# Patient Record
Sex: Male | Born: 1985
Health system: Southern US, Community
[De-identification: ages and names within clinical notes are randomized; demographics above are authoritative.]

## PROBLEM LIST (undated history)

## (undated) DIAGNOSIS — G5600 Carpal tunnel syndrome, unspecified upper limb: Secondary | ICD-10-CM

## (undated) DIAGNOSIS — E669 Obesity, unspecified: Secondary | ICD-10-CM

## (undated) HISTORY — DX: Carpal tunnel syndrome, unspecified upper limb: G56.00

## (undated) HISTORY — DX: Obesity, unspecified: E66.9

## (undated) HISTORY — PX: NO PAST SURGERIES: SHX2092

---

## 2014-11-05 ENCOUNTER — Encounter: Payer: Self-pay | Admitting: Family Medicine

## 2014-11-05 ENCOUNTER — Ambulatory Visit (INDEPENDENT_AMBULATORY_CARE_PROVIDER_SITE_OTHER): Admitting: Family Medicine

## 2014-11-05 ENCOUNTER — Ambulatory Visit
Admission: RE | Admit: 2014-11-05 | Discharge: 2014-11-05 | Disposition: A | Discharge status: Home / Self Care | Source: Ambulatory Visit | Attending: Family Medicine | Admitting: Family Medicine

## 2014-11-05 VITALS — BP 129/87 | HR 72 | Temp 98.1°F | Resp 16 | Ht 69.0 in | Wt 192.0 lb

## 2014-11-05 DIAGNOSIS — M722 Plantar fascial fibromatosis: Secondary | ICD-10-CM

## 2014-11-05 DIAGNOSIS — M79671 Pain in right foot: Secondary | ICD-10-CM | POA: Insufficient documentation

## 2014-11-05 DIAGNOSIS — M7661 Achilles tendinitis, right leg: Secondary | ICD-10-CM

## 2014-11-05 MED ORDER — NAPROXEN 500 MG PO TABS
500.0000 mg | ORAL_TABLET | Freq: Two times a day (BID) | ORAL | Status: DC
Start: 2014-11-05 — End: 2015-07-16

## 2014-11-05 NOTE — Patient Instructions (Signed)
Plantar Fasciitis  Plantar fasciitis is a common condition that causes foot pain. It is soreness (inflammation) of the band of tough fibrous tissue on the bottom of the foot that runs from the heel bone (calcaneus) to the ball of the foot. The cause of this soreness may be from excessive standing, poor fitting shoes, running on hard surfaces, being overweight, having an abnormal walk, or overuse (this is common in runners) of the painful foot or feet. It is also common in aerobic exercise dancers and ballet dancers.  SYMPTOMS   Most people with plantar fasciitis complain of:   Severe pain in the morning on the bottom of their foot especially when taking the first steps out of bed. This pain recedes after a few minutes of walking.   Severe pain is experienced also during walking following a long period of inactivity.   Pain is worse when walking barefoot or up stairs  DIAGNOSIS    Your caregiver will diagnose this condition by examining and feeling your foot.   Special tests such as X-rays of your foot, are usually not needed.  PREVENTION    Consult a sports medicine professional before beginning a new exercise program.   Walking programs offer a good workout. With walking there is a lower chance of overuse injuries common to runners. There is less impact and less jarring of the joints.   Begin all new exercise programs slowly. If problems or pain develop, decrease the amount of time or distance until you are at a comfortable level.   Wear good shoes and replace them regularly.   Stretch your foot and the heel cords at the back of the ankle (Achilles tendon) both before and after exercise.   Run or exercise on even surfaces that are not hard. For example, asphalt is better than pavement.   Do not run barefoot on hard surfaces.   If using a treadmill, vary the incline.   Do not continue to workout if you have foot or joint problems. Seek professional help if they do not improve.  HOME CARE INSTRUCTIONS     Avoid activities that cause you pain until you recover.   Use ice or cold packs on the problem or painful areas after working out.   Only take over-the-counter or prescription medicines for pain, discomfort, or fever as directed by your caregiver.   Soft shoe inserts or athletic shoes with air or gel sole cushions may be helpful.   If problems continue or become more severe, consult a sports medicine caregiver or your own health care provider. Cortisone is a potent anti-inflammatory medication that may be injected into the painful area. You can discuss this treatment with your caregiver.  MAKE SURE YOU:    Understand these instructions.   Will watch your condition.   Will get help right away if you are not doing well or get worse.  Document Released: 11/04/2000 Document Revised: 05/04/2011 Document Reviewed: 01/04/2008  [employer] Patient Information 2015 [employer]. This information is not intended to replace advice given to you by your health care provider. Make sure you discuss any questions you have with your health care provider.

## 2014-11-05 NOTE — Progress Notes (Signed)
Date:  11/05/2014   Name:  Jose Wheeler   DOB:  November 27, 1985   MRN:  [id number]  PCP:  Filbert Berthold, NP    Chief Complaint: Foot Pain   History of Present Illness:  This is a 29 y.o. male with 2.5 weeks of R heel pain worse in AM began after started running more but preventing him from running now. No known acute injury, no prior hx similar sxs.  Review of Systems:  Review of Systems  There are no active problems to display for this patient.   Prior to Admission medications   Medication Sig Start Date End Date Taking? Authorizing Provider  naproxen (NAPROSYN) 500 MG tablet Take 1 tablet (500 mg total) by mouth 2 (two) times daily with a meal. 11/05/14   Schuyler Amor, MD    Not on File  No past surgical history on file.  Social History  Substance Use Topics  . Smoking status: Not on file  . Smokeless tobacco: Not on file  . Alcohol Use: Not on file    No family history on file.  Medication list has been reviewed and updated.  Physical Examination: BP 129/87 mmHg  Pulse 72  Temp(Src) 98.1 F (36.7 C) (Oral)  Resp 16  Ht  (1.753 m)  Wt 192 lb (87.091 kg)  BMI 28.34 kg/m2  Physical Exam  Constitutional: He appears well-developed and well-nourished.  Musculoskeletal:  Tender anterior and posterior calcaneus, no obvious anatomic abnormality  Neurological: He is alert.  Skin: Skin is warm and dry.  Psychiatric: He has a normal mood and affect. His behavior is normal.    Assessment and Plan:  1. Heel pain, right R heel XR unremarkable per radiology - DG Os Calcis Right; Future  2. Plantar fasciitis of right foot Trial NSAID, exercises, OTC orthotics, consider podiatry referral if persists  3. Tendonitis, Achilles, right   Return if symptoms worsen or fail to improve.  Dionne Ano. Kingsley Spittle MD [facility]  11/05/2014

## 2015-02-08 ENCOUNTER — Encounter: Payer: Self-pay | Admitting: Family Medicine

## 2015-02-08 ENCOUNTER — Ambulatory Visit (INDEPENDENT_AMBULATORY_CARE_PROVIDER_SITE_OTHER): Admitting: Family Medicine

## 2015-02-08 VITALS — BP 140/88 | HR 88 | Temp 98.3°F | Resp 16 | Wt 190.0 lb

## 2015-02-08 DIAGNOSIS — J0111 Acute recurrent frontal sinusitis: Secondary | ICD-10-CM | POA: Diagnosis not present

## 2015-02-08 DIAGNOSIS — J020 Streptococcal pharyngitis: Secondary | ICD-10-CM

## 2015-02-08 DIAGNOSIS — J029 Acute pharyngitis, unspecified: Secondary | ICD-10-CM | POA: Diagnosis not present

## 2015-02-08 LAB — POCT RAPID STREP A (OFFICE): RAPID STREP A SCREEN: NEGATIVE

## 2015-02-08 MED ORDER — BENZONATATE 100 MG PO CAPS: 100.0000 mg | ORAL_CAPSULE | Freq: Two times a day (BID) | ORAL | Status: DC | PRN

## 2015-02-08 MED ORDER — AMOXICILLIN 500 MG PO CAPS: 500.0000 mg | ORAL_CAPSULE | Freq: Two times a day (BID) | ORAL | Status: DC

## 2015-02-08 NOTE — Progress Notes (Signed)
Subjective:    Patient ID: Jose Wheeler, male    DOB: 05/14/1985, 29 y.o.   MRN: [id number]  HPI: Jose Wheeler is a 29 y.o. male presenting on 02/08/2015 for Sore Throat   HPI  Pt presents for possible strep throat. All kids in the house are positive for strep as is his wife. . Pt has also had a cold. Sinus drainage, cough, pressure x 2 weeks. No chest tightness or trouble breathing. No fevers. Throat is sore. Ear pain.   No past medical history on file.  Current Outpatient Prescriptions on File Prior to Visit  Medication Sig  . naproxen (NAPROSYN) 500 MG tablet Take 1 tablet (500 mg total) by mouth 2 (two) times daily with a meal.   No current facility-administered medications on file prior to visit.    Review of Systems  Constitutional: Negative for fever and chills.  HENT: Positive for congestion, rhinorrhea, sinus pressure, sore throat and trouble swallowing. Negative for ear discharge, ear pain, postnasal drip and sneezing.   Respiratory: Positive for cough. Negative for chest tightness, shortness of breath and wheezing.   Cardiovascular: Negative for chest pain, palpitations and leg swelling.  Skin: Negative.   Allergic/Immunologic: Positive for environmental allergies.  Neurological: Negative for headaches.   Per HPI unless specifically indicated above     Objective:    BP 140/88 mmHg  Pulse 88  Temp(Src) 98.3 F (36.8 C)  Resp 16  Wt 190 lb (86.183 kg)  Wt Readings from Last 3 Encounters:  02/08/15 190 lb (86.183 kg)  11/05/14 192 lb (87.091 kg)    Physical Exam  Constitutional: He appears well-developed and well-nourished. No distress.  HENT:  Head: Normocephalic and atraumatic.  Right Ear: Tympanic membrane is not erythematous and not bulging.  Left Ear: Tympanic membrane is not erythematous and not bulging.  Nose: Mucosal edema, rhinorrhea and nasal septal hematoma present. No sinus tenderness. Right sinus exhibits maxillary sinus tenderness and frontal sinus  tenderness. Left sinus exhibits no maxillary sinus tenderness and no frontal sinus tenderness.  Mouth/Throat: Uvula is midline and mucous membranes are normal. No uvula swelling. Posterior oropharyngeal erythema (beefy red) present. No posterior oropharyngeal edema.  Neck: Normal range of motion. Neck supple. No Brudzinski's sign and no Kernig's sign noted.  Cardiovascular: Normal rate, regular rhythm and normal heart sounds.   Pulmonary/Chest: Effort normal and breath sounds normal. No accessory muscle usage. No tachypnea. No respiratory distress. He has no decreased breath sounds. He has no wheezes. He has no rhonchi. He has no rales.  Lymphadenopathy:    He has no cervical adenopathy.   Results for orders placed or performed in visit on 02/08/15  POCT rapid strep A  Result Value Ref Range   Rapid Strep A Screen Negative Negative      Assessment & Plan:   Problem List Items Addressed This Visit    None    Visit Diagnoses    Strep pharyngitis    -  Primary    Treat for strep considering family exposure. Send throat culture. Supportive care at home. RTC if symptoms not improved.     Relevant Medications    amoxicillin (AMOXIL) 500 MG capsule    Other Relevant Orders    POCT rapid strep A (Completed)    Culture, Group A Strep    Acute recurrent frontal sinusitis        Treat for sinus infection given duration of symptoms and clinical exam. Supportive care at home. RTC if not  improving.     Relevant Medications    amoxicillin (AMOXIL) 500 MG capsule    benzonatate (TESSALON) 100 MG capsule       Meds ordered this encounter  Medications  . amoxicillin (AMOXIL) 500 MG capsule    Sig: Take 1 capsule (500 mg total) by mouth 2 (two) times daily.    Dispense:  30 capsule    Refill:  0    Order Specific Question:  Supervising Provider    Answer:  Janeann Forehand [[id number]]  . benzonatate (TESSALON) 100 MG capsule    Sig: Take 1 capsule (100 mg total) by mouth 2 (two) times daily  as needed for cough.    Dispense:  30 capsule    Refill:  0    Order Specific Question:  Supervising Provider    Answer:  Janeann Forehand [[id number]]      Follow up plan: Return if symptoms worsen or fail to improve.

## 2015-02-08 NOTE — Patient Instructions (Signed)

## 2015-02-10 LAB — CULTURE, GROUP A STREP: Strep A Culture: NEGATIVE

## 2015-07-16 ENCOUNTER — Encounter: Payer: Self-pay | Admitting: Family Medicine

## 2015-07-16 ENCOUNTER — Ambulatory Visit (INDEPENDENT_AMBULATORY_CARE_PROVIDER_SITE_OTHER): Payer: 59 | Admitting: Family Medicine

## 2015-07-16 VITALS — BP 132/72 | HR 83 | Temp 98.5°F | Resp 16 | Ht 68.0 in | Wt 199.0 lb

## 2015-07-16 DIAGNOSIS — F419 Anxiety disorder, unspecified: Secondary | ICD-10-CM

## 2015-07-16 DIAGNOSIS — R0789 Other chest pain: Secondary | ICD-10-CM

## 2015-07-16 MED ORDER — ESCITALOPRAM OXALATE 10 MG PO TABS: 10.0000 mg | ORAL_TABLET | Freq: Every day | ORAL | Status: DC

## 2015-07-16 MED ORDER — RANITIDINE HCL 150 MG PO TABS: 150.0000 mg | ORAL_TABLET | Freq: Every day | ORAL | Status: DC

## 2015-07-16 NOTE — Patient Instructions (Signed)
I think your chest pain is anxiety related. However we will keep a close eye on it. If you continue to have chest pressure or tightness, palpitations, nausea, shortness of breath or other concerning symptoms, please go to the ER.  Anxiety: Lexapro- take 1/2 tablet at bedtime for 4 days, then increase to full tablet. Do not stop abruptly or double dose. Try 30 minutes of exercise daily. Youtube has great resources for mindfulness and meditation.

## 2015-07-16 NOTE — Assessment & Plan Note (Signed)
Likely source of chest symptoms. Start lexapro once daily. Reviewed non-pharm measures of mindfulness and meditation. Encouraged 30 minutes exercise daily. Check TSH, Vitamin B12, vitamin D.  Recheck 4 weeks.

## 2015-07-16 NOTE — Progress Notes (Signed)
Subjective:    Patient ID: Jose Wheeler, male    DOB: 04/01/1985, [age] y.o.   MRN: [id number]  HPI: Jose Wheeler is a 30 y.o. male presenting on 07/16/2015 for Chest Pain   HPI  Pt presents for chest pain x 1.5 months. Chest pain is most prominent R sided on sternal border-however chest tightness across the chest. Pain is described as "stitch" in the chest. Pain occurs at rest and with activity. Pain occurs when he feels anxious. Lasts about 30 minutes or more. No associated shortness of breath. No diaphoresis. Not radiating. No heavy lifting. Not tender to touch. When he feels stressed it occurs. Pain is relieved by taking a deep breath. Does not occur after meals. Has had some changes in life- left the [employer], insurance change, trying to buy house.  Family history of heart disease in grandfather after age 30.   Has not been ER for evaluation.   No past medical history on file.  No current outpatient prescriptions on file prior to visit.   No current facility-administered medications on file prior to visit.    Review of Systems  Constitutional: Negative for fever and chills.  HENT: Negative.   Respiratory: Positive for chest tightness. Negative for shortness of breath and wheezing.   Cardiovascular: Positive for chest pain. Negative for palpitations and leg swelling.  Gastrointestinal: Negative for nausea, vomiting and abdominal pain.  Endocrine: Negative.   Genitourinary: Negative for dysuria, urgency, discharge, penile pain and testicular pain.  Musculoskeletal: Negative for back pain, joint swelling and arthralgias.  Skin: Negative.   Neurological: Negative for dizziness, weakness, numbness and headaches.  Psychiatric/Behavioral: Negative for suicidal ideas, sleep disturbance and dysphoric mood. The patient is nervous/anxious.    Per HPI unless specifically indicated above GAD 7 : Generalized Anxiety Score 07/16/2015  Nervous, Anxious, on Edge 2  Control/stop worrying 2  Worry too  much - different things 3  Trouble relaxing 2  Restless 1  Easily annoyed or irritable 3  Afraid - awful might happen 0  Total GAD 7 Score 13  Anxiety Difficulty Somewhat difficult      Objective:    BP 132/72 mmHg  Pulse 83  Temp(Src) 98.5 F (36.9 C) (Oral)  Resp 16  Ht 5\' 8"  (1.727 m)  Wt 199 lb (90.266 kg)  BMI 30.26 kg/m2  Wt Readings from Last 3 Encounters:  07/16/15 199 lb (90.266 kg)  02/08/15 190 lb (86.183 kg)  11/05/14 192 lb (87.091 kg)    Physical Exam  Constitutional: He is oriented to person, place, and time. He appears well-developed and well-nourished. No distress.  HENT:  Head: Normocephalic and atraumatic.  Neck: Neck supple. No thyromegaly present.  Cardiovascular: Normal rate, regular rhythm and normal heart sounds.  Exam reveals no gallop and no friction rub.   No murmur heard. Pulmonary/Chest: Effort normal and breath sounds normal. He has no wheezes.  Abdominal: Soft. Bowel sounds are normal. He exhibits no distension. There is no tenderness. There is no rebound.  Musculoskeletal: Normal range of motion. He exhibits no edema or tenderness.  Neurological: He is alert and oriented to person, place, and time. He has normal reflexes.  Skin: Skin is warm and dry. No rash noted. No erythema.  Psychiatric: He has a normal mood and affect. His behavior is normal. Thought content normal.   Results for orders placed or performed in visit on 02/08/15  Culture, Group A Strep  Result Value Ref Range   Strep A Culture Negative  POCT rapid strep A  Result Value Ref Range   Rapid Strep A Screen Negative Negative      Assessment & Plan:   Problem List Items Addressed This Visit      Other   Anxiety    Likely source of chest symptoms. Start lexapro once daily. Reviewed non-pharm measures of mindfulness and meditation. Encouraged 30 minutes exercise daily. Check TSH, Vitamin B12, vitamin D.  Recheck 4 weeks.       Relevant Medications   escitalopram  (LEXAPRO) 10 MG tablet   Other Relevant Orders   TSH   VITAMIN D 25 Hydroxy (Vit-D Deficiency, Fractures)   B12 and Folate Panel    Other Visit Diagnoses    Right-sided chest wall pain    -  Primary    Likely non-cardiac. ECG WNL. MSK vs anxiety related. Treating anxiety. Check labs to stratify risk- CBC, lipid, CMET. Consider cardiology if symptoms persist. To ER with continued symptoms. Return 2 weeks.     Relevant Medications    ranitidine (ZANTAC) 150 MG tablet    Other Relevant Orders    EKG 12-Lead    Comprehensive metabolic panel    Lipid Profile    CBC With Differential       Meds ordered this encounter  Medications  . escitalopram (LEXAPRO) 10 MG tablet    Sig: Take 1 tablet (10 mg total) by mouth daily.    Dispense:  30 tablet    Refill:  11    Order Specific Question:  Supervising Provider    Answer:  Janeann Forehand [phone]  . DISCONTD: ranitidine (ZANTAC) 150 MG tablet    Sig: Take 1 tablet (150 mg total) by mouth at bedtime.    Dispense:  30 tablet    Order Specific Question:  Supervising Provider    Answer:  Janeann Forehand [phone]  . ranitidine (ZANTAC) 150 MG tablet    Sig: Take 1 tablet (150 mg total) by mouth at bedtime.    Dispense:  30 tablet    Refill:  1    Order Specific Question:  Supervising Provider    Answer:  Janeann Forehand [[id number]]      Follow up plan: Return in about 2 weeks (around 07/30/2015), or if symptoms worsen or fail to improve, for chest pain, 4 weeks for anxiety. Marland Kitchen

## 2015-10-21 ENCOUNTER — Ambulatory Visit (INDEPENDENT_AMBULATORY_CARE_PROVIDER_SITE_OTHER): Payer: 59 | Admitting: Family Medicine

## 2015-10-21 VITALS — BP 126/84 | HR 82 | Temp 99.0°F | Resp 16 | Ht 68.0 in | Wt 207.0 lb

## 2015-10-21 DIAGNOSIS — M722 Plantar fascial fibromatosis: Secondary | ICD-10-CM

## 2015-10-21 DIAGNOSIS — E669 Obesity, unspecified: Secondary | ICD-10-CM | POA: Diagnosis not present

## 2015-10-21 MED ORDER — NAPROXEN 500 MG PO TABS: 500.0000 mg | ORAL_TABLET | Freq: Two times a day (BID) | ORAL | 1 refills | Status: DC

## 2015-10-21 NOTE — Progress Notes (Signed)
Subjective:    Patient ID: Jose Wheeler, male    DOB: [birth year], 30 y.o.   MRN: [id number]  HPI: Jose Wheeler is a 30 y.o. male presenting on 10/21/2015 for Obesity (weight gain BMI high for insurance)   HPI  Pt presents for obesity. He needs an appeal for [employer] insurance. Has gained some weight since leaving the [employer]. Has late night eating- eats a bowl of cereal. Drinks soda- drinks about 1/2 liter of soda per day. No juice. 2% milk and water.  Also having R heel pain- burns on achielles and the heel. Injured in the winter. Has seen chiropractor and PT. No major help.   No past medical history on file.  Current Outpatient Prescriptions on File Prior to Visit  Medication Sig  . escitalopram (LEXAPRO) 10 MG tablet Take 1 tablet (10 mg total) by mouth daily.  . ranitidine (ZANTAC) 150 MG tablet Take 1 tablet (150 mg total) by mouth at bedtime.   No current facility-administered medications on file prior to visit.     Review of Systems  Constitutional: Negative for chills and fever.  HENT: Negative.   Respiratory: Negative for chest tightness, shortness of breath and wheezing.   Cardiovascular: Negative for chest pain, palpitations and leg swelling.  Gastrointestinal: Negative for abdominal pain, nausea and vomiting.  Endocrine: Negative.   Genitourinary: Negative for discharge, dysuria, penile pain, testicular pain and urgency.  Musculoskeletal: Negative for arthralgias, back pain and joint swelling.  Skin: Negative.   Neurological: Negative for dizziness, weakness, numbness and headaches.  Psychiatric/Behavioral: Negative for dysphoric mood and sleep disturbance.   Per HPI unless specifically indicated above     Objective:    BP 126/84 (BP Location: Left Arm, Patient Position: Sitting, Cuff Size: Normal)   Pulse 82   Temp 99 F (37.2 C) (Oral)   Resp 16   Ht 5\' 8"  (1.727 m)   Wt 207 lb (93.9 kg)   BMI 31.47 kg/m   Wt Readings from Last 3 Encounters:  10/21/15 207  lb (93.9 kg)  07/16/15 199 lb (90.3 kg)  02/08/15 190 lb (86.2 kg)    Physical Exam  Constitutional: He is oriented to person, place, and time. He appears well-developed and well-nourished. No distress.  HENT:  Head: Normocephalic and atraumatic.  Neck: Neck supple. No thyromegaly present.  Cardiovascular: Normal rate, regular rhythm and normal heart sounds.  Exam reveals no gallop and no friction rub.   No murmur heard. Pulmonary/Chest: Effort normal and breath sounds normal. He has no wheezes.  Abdominal: Soft. Bowel sounds are normal. He exhibits no distension. There is no tenderness. There is no rebound.  Musculoskeletal: Normal range of motion. He exhibits no edema.       Right foot: There is tenderness.       Feet:  Neurological: He is alert and oriented to person, place, and time. He has normal reflexes.  Skin: Skin is warm and dry. No rash noted. No erythema.  Psychiatric: He has a normal mood and affect. His behavior is normal. Thought content normal.   Results for orders placed or performed in visit on 02/08/15  Culture, Group A Strep  Result Value Ref Range   Strep A Culture Negative   POCT rapid strep A  Result Value Ref Range   Rapid Strep A Screen Negative Negative      Assessment & Plan:   Problem List Items Addressed This Visit      Other   Obesity  Discussed strategies for healthy eating. Avoid soda. Refer to nutrition. Exercise 150 minutes per week. Recheck weight 6 weeks.       Relevant Orders   Amb ref to Medical Nutrition Therapy-MNT    Other Visit Diagnoses    Plantar fasciitis, right    -  Primary   Naproxen BID. Ice the heal. Consider podiatry referral if not improving.    Relevant Medications   naproxen (NAPROSYN) 500 MG tablet      Meds ordered this encounter  Medications  . naproxen (NAPROSYN) 500 MG tablet    Sig: Take 1 tablet (500 mg total) by mouth 2 (two) times daily with a meal.    Dispense:  30 tablet    Refill:  1    Order  Specific Question:   Supervising Provider    Answer:   Janeann ForehandHAWKINS JR, JAMES H [[id number]]      Follow up plan: Return in about 6 weeks (around 12/02/2015) for Weight check.

## 2015-10-21 NOTE — Assessment & Plan Note (Signed)
Discussed strategies for healthy eating. Avoid soda. Refer to nutrition. Exercise 150 minutes per week. Recheck weight 6 weeks.

## 2015-10-21 NOTE — Patient Instructions (Addendum)
Your BMI is considered obese.  Losing weight will help you maintain health throughout your lifespan and prevent the development of chronic health conditions.  Please try to meet the goal of 150 minutes of exercise per week.  This is generally 30-40 minutes of moderate activity 3-4 times per week. Consider a calorie goal of 1800 per day. Eat mainly fruits, vegetables, and lean proteins (chicken or fish).  A great resource for healthy living and weight loss is https://hernandez-anderson.info/ Reduce with a goal to eliminate soda.  Call the [facility]- [phone]    Plantar Fasciitis With Rehab The plantar fascia is a fibrous, ligament-like, soft-tissue structure that spans the bottom of the foot. Plantar fasciitis, also called heel spur syndrome, is a condition that causes pain in the foot due to inflammation of the tissue. SYMPTOMS   Pain and tenderness on the underneath side of the foot.  Pain that worsens with standing or walking. CAUSES  Plantar fasciitis is caused by irritation and injury to the plantar fascia on the underneath side of the foot. Common mechanisms of injury include:  Direct trauma to bottom of the foot.  Damage to a small nerve that runs under the foot where the main fascia attaches to the heel bone.  Stress placed on the plantar fascia due to bone spurs. RISK INCREASES WITH:   Activities that place stress on the plantar fascia (running, jumping, pivoting, or cutting).  Poor strength and flexibility.  Improperly fitted shoes.  Tight calf muscles.  Flat feet.  Failure to warm-up properly before activity.  Obesity. PREVENTION  Warm up and stretch properly before activity.  Allow for adequate recovery between workouts.  Maintain physical fitness:  Strength, flexibility, and endurance.  Cardiovascular fitness.  Maintain a health body weight.  Avoid stress on the plantar fascia.  Wear properly fitted shoes, including arch supports for individuals who  have flat feet. PROGNOSIS  If treated properly, then the symptoms of plantar fasciitis usually resolve without surgery. However, occasionally surgery is necessary. RELATED COMPLICATIONS   Recurrent symptoms that may result in a chronic condition.  Problems of the lower back that are caused by compensating for the injury, such as limping.  Pain or weakness of the foot during push-off following surgery.  Chronic inflammation, scarring, and partial or complete fascia tear, occurring more often from repeated injections. TREATMENT  Treatment initially involves the use of ice and medication to help reduce pain and inflammation. The use of strengthening and stretching exercises may help reduce pain with activity, especially stretches of the Achilles tendon. These exercises may be performed at home or with a therapist. Your caregiver may recommend that you use heel cups of arch supports to help reduce stress on the plantar fascia. Occasionally, corticosteroid injections are given to reduce inflammation. If symptoms persist for greater than 6 months despite non-surgical (conservative), then surgery may be recommended.  MEDICATION   If pain medication is necessary, then nonsteroidal anti-inflammatory medications, such as aspirin and ibuprofen, or other minor pain relievers, such as acetaminophen, are often recommended.  Do not take pain medication within 7 days before surgery.  Prescription pain relievers may be given if deemed necessary by your caregiver. Use only as directed and only as much as you need.  Corticosteroid injections may be given by your caregiver. These injections should be reserved for the most serious cases, because they may only be given a certain number of times. HEAT AND COLD  Cold treatment (icing) relieves pain and reduces inflammation. Cold  treatment should be applied for 10 to 15 minutes every 2 to 3 hours for inflammation and pain and immediately after any activity that  aggravates your symptoms. Use ice packs or massage the area with a piece of ice (ice massage).  Heat treatment may be used prior to performing the stretching and strengthening activities prescribed by your caregiver, physical therapist, or athletic trainer. Use a heat pack or soak the injury in warm water. SEEK IMMEDIATE MEDICAL CARE IF:  Treatment seems to offer no benefit, or the condition worsens.  Any medications produce adverse side effects. EXERCISES RANGE OF MOTION (ROM) AND STRETCHING EXERCISES - Plantar Fasciitis (Heel Spur Syndrome) These exercises may help you when beginning to rehabilitate your injury. Your symptoms may resolve with or without further involvement from your physician, physical therapist or athletic trainer. While completing these exercises, remember:   Restoring tissue flexibility helps normal motion to return to the joints. This allows healthier, less painful movement and activity.  An effective stretch should be held for at least 30 seconds.  A stretch should never be painful. You should only feel a gentle lengthening or release in the stretched tissue. RANGE OF MOTION - Toe Extension, Flexion  Sit with your right / left leg crossed over your opposite knee.  Grasp your toes and gently pull them back toward the top of your foot. You should feel a stretch on the bottom of your toes and/or foot.  Hold this stretch for __________ seconds.  Now, gently pull your toes toward the bottom of your foot. You should feel a stretch on the top of your toes and or foot.  Hold this stretch for __________ seconds. Repeat __________ times. Complete this stretch __________ times per day.  RANGE OF MOTION - Ankle Dorsiflexion, Active Assisted  Remove shoes and sit on a chair that is preferably not on a carpeted surface.  Place right / left foot under knee. Extend your opposite leg for support.  Keeping your heel down, slide your right / left foot back toward the chair  until you feel a stretch at your ankle or calf. If you do not feel a stretch, slide your bottom forward to the edge of the chair, while still keeping your heel down.  Hold this stretch for __________ seconds. Repeat __________ times. Complete this stretch __________ times per day.  STRETCH - Gastroc, Standing  Place hands on wall.  Extend right / left leg, keeping the front knee somewhat bent.  Slightly point your toes inward on your back foot.  Keeping your right / left heel on the floor and your knee straight, shift your weight toward the wall, not allowing your back to arch.  You should feel a gentle stretch in the right / left calf. Hold this position for __________ seconds. Repeat __________ times. Complete this stretch __________ times per day. STRETCH - Soleus, Standing  Place hands on wall.  Extend right / left leg, keeping the other knee somewhat bent.  Slightly point your toes inward on your back foot.  Keep your right / left heel on the floor, bend your back knee, and slightly shift your weight over the back leg so that you feel a gentle stretch deep in your back calf.  Hold this position for __________ seconds. Repeat __________ times. Complete this stretch __________ times per day. STRETCH - Gastrocsoleus, Standing  Note: This exercise can place a lot of stress on your foot and ankle. Please complete this exercise only if specifically instructed by  your caregiver.   Place the ball of your right / left foot on a step, keeping your other foot firmly on the same step.  Hold on to the wall or a rail for balance.  Slowly lift your other foot, allowing your body weight to press your heel down over the edge of the step.  You should feel a stretch in your right / left calf.  Hold this position for __________ seconds.  Repeat this exercise with a slight bend in your right / left knee. Repeat __________ times. Complete this stretch __________ times per day.    STRENGTHENING EXERCISES - Plantar Fasciitis (Heel Spur Syndrome)  These exercises may help you when beginning to rehabilitate your injury. They may resolve your symptoms with or without further involvement from your physician, physical therapist or athletic trainer. While completing these exercises, remember:   Muscles can gain both the endurance and the strength needed for everyday activities through controlled exercises.  Complete these exercises as instructed by your physician, physical therapist or athletic trainer. Progress the resistance and repetitions only as guided. STRENGTH - Towel Curls  Sit in a chair positioned on a non-carpeted surface.  Place your foot on a towel, keeping your heel on the floor.  Pull the towel toward your heel by only curling your toes. Keep your heel on the floor.  If instructed by your physician, physical therapist or athletic trainer, add ____________________ at the end of the towel. Repeat __________ times. Complete this exercise __________ times per day. STRENGTH - Ankle Inversion  Secure one end of a rubber exercise band/tubing to a fixed object (table, pole). Loop the other end around your foot just before your toes.  Place your fists between your knees. This will focus your strengthening at your ankle.  Slowly, pull your big toe up and in, making sure the band/tubing is positioned to resist the entire motion.  Hold this position for __________ seconds.  Have your muscles resist the band/tubing as it slowly pulls your foot back to the starting position. Repeat __________ times. Complete this exercises __________ times per day.    This information is not intended to replace advice given to you by your health care provider. Make sure you discuss any questions you have with your health care provider.   Document Released: 02/09/2005 Document Revised: 06/26/2014 Document Reviewed: 05/24/2008 Elsevier Interactive Patient Education [employer]2016 Elsevier  Inc.

## 2015-12-02 ENCOUNTER — Ambulatory Visit: Payer: 59 | Admitting: Family Medicine

## 2016-04-17 ENCOUNTER — Ambulatory Visit (INDEPENDENT_AMBULATORY_CARE_PROVIDER_SITE_OTHER): Payer: 59 | Admitting: Physician Assistant

## 2016-04-17 ENCOUNTER — Encounter: Payer: Self-pay | Admitting: Physician Assistant

## 2016-04-17 VITALS — BP 137/76 | HR 85 | Temp 98.2°F | Resp 16 | Ht 68.0 in | Wt 209.0 lb

## 2016-04-17 DIAGNOSIS — J0110 Acute frontal sinusitis, unspecified: Secondary | ICD-10-CM | POA: Diagnosis not present

## 2016-04-17 MED ORDER — AMOXICILLIN-POT CLAVULANATE 875-125 MG PO TABS: 1.0000 | ORAL_TABLET | Freq: Two times a day (BID) | ORAL | 0 refills | Status: AC

## 2016-04-17 NOTE — Progress Notes (Signed)
   Subjective:    Patient ID: Jose ArJerry Pluta, male    DOB: 05/04/1985, 31 y.o.   MRN: [id number]  Jose Wheeler is a 31 y.o. male presenting on 04/17/2016 for Cough   HPI  Patient is a 31 y/o man with no contributory PMH presenting today with cough ongoing for two weeks. He has two young children who were sick two weeks ago and he caught an URI illness from them. He was ill for about a week, was starting to get better, and then started to feel worse. His cough has been persistent but nonproductive. Does endorse some sinus congestion and ear fullness. No fevers, chills, nausea, vomiting.   Social History  Substance Use Topics  . Smoking status: Never Smoker  . Smokeless tobacco: Never Used  . Alcohol use No    Review of Systems Per HPI unless specifically indicated above     Objective:    BP 137/76 (BP Location: Right Arm, Patient Position: Sitting, Cuff Size: Normal)   Pulse 85   Temp 98.2 F (36.8 C) (Oral)   Resp 16   Ht 5\' 8"  (1.727 m)   Wt 209 lb (94.8 kg)   BMI 31.78 kg/m   Wt Readings from Last 3 Encounters:  04/17/16 209 lb (94.8 kg)  10/21/15 207 lb (93.9 kg)  07/16/15 199 lb (90.3 kg)    Physical Exam Results for orders placed or performed in visit on 02/08/15  Culture, Group A Strep  Result Value Ref Range   Strep A Culture Negative   POCT rapid strep A  Result Value Ref Range   Rapid Strep A Screen Negative Negative      Assessment & Plan:   1. Acute non-recurrent frontal sinusitis  - amoxicillin-clavulanate (AUGMENTIN) 875-125 MG tablet; Take 1 tablet by mouth 2 (two) times daily.  Dispense: 20 tablet; Refill: 0  Return if symptoms worsen or fail to improve.  Osvaldo AngstAdriana Pollak, PA-C [facility] 04/17/2016, 10:04 AM

## 2016-04-17 NOTE — Patient Instructions (Signed)

## 2016-05-12 DIAGNOSIS — J019 Acute sinusitis, unspecified: Secondary | ICD-10-CM | POA: Diagnosis not present

## 2016-05-12 DIAGNOSIS — J029 Acute pharyngitis, unspecified: Secondary | ICD-10-CM | POA: Diagnosis not present

## 2016-06-11 ENCOUNTER — Ambulatory Visit
Admission: RE | Admit: 2016-06-11 | Discharge: 2016-06-11 | Disposition: A | Discharge status: Home / Self Care | Payer: 59 | Source: Ambulatory Visit | Attending: Nurse Practitioner | Admitting: Nurse Practitioner

## 2016-06-11 ENCOUNTER — Ambulatory Visit (INDEPENDENT_AMBULATORY_CARE_PROVIDER_SITE_OTHER): Payer: 59 | Admitting: Nurse Practitioner

## 2016-06-11 ENCOUNTER — Encounter: Payer: Self-pay | Admitting: Nurse Practitioner

## 2016-06-11 VITALS — BP 141/85 | HR 82 | Temp 98.7°F | Resp 16 | Ht 68.0 in | Wt 214.0 lb

## 2016-06-11 DIAGNOSIS — M25531 Pain in right wrist: Secondary | ICD-10-CM

## 2016-06-11 NOTE — Progress Notes (Signed)
Subjective:    Patient ID: Jose Wheeler, male    DOB: [birth year], 31 y.o.   MRN: [id number]  Jose Wheeler is a 31 y.o. male presenting on 06/11/2016 for Wrist Pain   HPI  Right wrist pain Pain started about 1 week ago.  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  He notices that his 5th digit pops out of place in the monrings.  His pain is present from wrist (carpals) to the MCP of 5th finger. 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  At home, it is painful to wash his face with a washrag, use his razor or write on paper - gripping the pen.  He has made some modifying actions to alleviate the pain.  Despite modifications, he still has a dull ache.  Every time with the wrong bend of the wrist, he has a quick burning sensation along lateral side of hand.  He has not taken anything for it.  He has applied some ice (frozen veggies) and that helped a little.  Possible trauma - at work and hit wrist hard on the counter.  Had no pain with the actual injury, but pain 2-3 days later.  Patient states he does not want to file worker's compensation.   Social History  Substance Use Topics  . Smoking status: Never Smoker  . Smokeless tobacco: Never Used  . Alcohol use No    Review of Systems Per HPI unless specifically indicated above     Objective:    BP (!) 141/85 (BP Location: Left Arm, Patient Position: Sitting, Cuff Size: Large)   Pulse 82   Temp 98.7 F (37.1 C) (Oral)   Resp 16   Ht  (1.727 m)   Wt 214 lb (97.1 kg)   BMI 32.54 kg/m   Wt Readings from Last 3 Encounters:  06/11/16 214 lb (97.1 kg)  04/17/16 209 lb (94.8 kg)  10/21/15 207 lb (93.9 kg)    Physical Exam  Constitutional: He is oriented to person, place, and time. He appears well-developed and well-nourished. No distress.  HENT:  Head: Normocephalic and atraumatic.  Musculoskeletal:       Right elbow: Normal.      Right wrist:  He exhibits decreased range of motion, tenderness, bony tenderness and crepitus.       Right hand: He exhibits normal range of motion.       Hands: No pain with internal or external rotation of the wrist. He does have pain from MCP to carpal when decreasing angle between thumb and radius. He also has pain in carpals of 5th digit when decreasing the angle between 5th finger and ulna.  Decreased strength with fifth finger to thumb.  Normal PIP and DIP of 5th finger.  Neurological: He is alert and oriented to person, place, and time. He has normal reflexes.  Skin: Skin is warm and dry.  Psychiatric: He has a normal mood and affect. His behavior is normal. Judgment and thought content normal.   DG Wrist Complete Right CLINICAL DATA:  Right wrist pain.  Prior injury.  EXAM: RIGHT WRIST - COMPLETE 3+ VIEW  COMPARISON:  No recent prior.  FINDINGS: There is no evidence of fracture or dislocation. There is no evidence of arthropathy or other focal bone abnormality. Soft tissues are unremarkable.  IMPRESSION: No acute or focal abnormality identified.  No evidence of fracture.  Electronically Signed  ByMaisie Fus  Register   On: 06/11/2016 12:08      Assessment & Plan:   Problem List Items Addressed This Visit    None    Visit Diagnoses    Acute pain of right wrist    -  Primary Acute pain present and reproducible.  Bony tenderness of carpal.  Plan: 1. Wrist X-Ray series to identify any bony abnormalities with possible trauma. 2. Consider occupational therapy for return to work, stretching, strengthening, and pain reduction. 3. Possible referral to orthopedics if needed. 4. Use anti-inflammatory therapies: - Choose one NSAID - ibuprofen or Aleeve and take every 8-12 hours daily for 2 weeks. - Take acetaminophen extra strength 1-2 tablets every 6-8 hours.  Max 3,000 mg daily. - Use heat/ice. - May use muscle rub with lidocaine for pain relief.  Use after heat or ice, not  together. 5. Follow up as needed in 2-4 weeks for continuing pain or worsening of symptoms.   Relevant Orders   DG Wrist Complete Right (Completed)          Follow up plan: Return if symptoms worsen or fail to improve.   [name], DNP, AGPCNP-BC Adult Gerontology Primary Care Nurse Practitioner [facility] 06/14/2016, 3:29 PM

## 2016-06-11 NOTE — Patient Instructions (Addendum)
Jose Wheeler, Thank you for coming in to clinic today.  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  - Start taking acetaminophen Tylenol extra strength 1 to 2 tablets every 6-8 hours for aches or fever/chills for next few days as needed.  Do not take more than 3,000 mg in 24 hours from all medicines.    - NSAID - choose one: May take Ibuprofen as well if tolerated 200-400mg  every 8 hours OR naproxen sodium (Aleeve) 220 mg  (1 pill) every 12 hours.     - Can alternate acetaminophen and one NSAID in the same 24 hour period.  Continue for 2 weeks then stop.  After 2 weeks, use either medicine as needed for pain.  - Use heat and ice.  Apply this for 15 minutes at a time 6-8 times per day.   - Muscle rub with lidocaine.  Avoid using this with heat and ice to avoid burns.  Ongoing care options: - Please let me know if you need a splint. - Consider referral to Orthopedics for additional workup or treatment. - Also consider Occupational therapy to strengthen wrist for "return to work."  Please schedule a follow-up appointment with [name], AGNP in 1-2 weeks as needed if persistent or worsening pain.  If you have any other questions or concerns, please feel free to call the clinic or send a message through MyChart. You may also schedule an earlier appointment if necessary.  [name], DNP, AGNP-BC Adult Gerontology Nurse Practitioner [facility]    Wrist Pain, Adult There are many things that can cause wrist pain. Some common causes include:  An injury to the wrist area, such as a sprain, strain, or fracture.  Overuse of the joint.  A condition that causes increased pressure on a nerve in the wrist (carpal tunnel syndrome).  Wear and tear of the joints that occurs with aging (osteoarthritis).  A variety of other types of arthritis. Sometimes, the cause of wrist pain is not known. Often,  the pain goes away when you follow instructions from your health care provider for relieving pain at home, such as resting or icing the wrist. If your wrist pain continues, it is important to tell your health care provider. Follow these instructions at home:  Rest the wrist area for at least 48 hours or as long as told by your health care provider.  If a splint or elastic bandage has been applied, use it as told by your health care provider.  Remove the splint or bandage only as told by your health care provider.  Loosen the splint or bandage if your fingers tingle, become numb, or turn cold or blue.  If directed, apply ice to the injured area.  If you have a removable splint or elastic bandage, remove it as told by your health care provider.  Put ice in a plastic bag.  Place a towel between your skin and the bag or between your splint or bandage and the bag.  Leave the ice on for 20 minutes, 2-3 times a day.  Keep your arm raised (elevated) above the level of your heart while you are sitting or lying down.  Take over-the-counter and prescription medicines only as told by your health care provider.  Keep all follow-up visits as told by your health care provider. This is important. Contact a health care provider if:  You have a sudden  sharp pain in the wrist, hand, or arm that is different or new.  The swelling or bruising on your wrist or hand gets worse.  Your skin becomes red, gets a rash, or has open sores.  Your pain does not get better or it gets worse. Get help right away if:  You lose feeling in your fingers or hand.  Your fingers turn white, very red, or cold and blue.  You cannot move your fingers.  You have a fever or chills. This information is not intended to replace advice given to you by your health care provider. Make sure you discuss any questions you have with your health care provider. Document Released: 11/19/2004 Document Revised: 09/05/2015 Document  Reviewed: 08/29/2015 Elsevier Interactive Patient Education  2017 [employer].

## 2016-06-15 NOTE — Progress Notes (Signed)
I have reviewed this encounter including the documentation in this note and/or discussed this patient with the provider, [name], AGPCNP-BC. I am certifying that I agree with the content of this note as supervising physician.  Saralyn Pilar, DO [facility] 06/15/2016, 2:56 PM

## 2016-08-14 ENCOUNTER — Encounter: Payer: Self-pay | Admitting: Nurse Practitioner

## 2016-08-14 ENCOUNTER — Ambulatory Visit (INDEPENDENT_AMBULATORY_CARE_PROVIDER_SITE_OTHER): Payer: 59 | Admitting: Nurse Practitioner

## 2016-08-14 DIAGNOSIS — F419 Anxiety disorder, unspecified: Secondary | ICD-10-CM | POA: Diagnosis not present

## 2016-08-14 MED ORDER — BUSPIRONE HCL 5 MG PO TABS: 5.0000 mg | ORAL_TABLET | Freq: Three times a day (TID) | ORAL | 1 refills | Status: DC

## 2016-08-14 MED ORDER — ESCITALOPRAM OXALATE 10 MG PO TABS: 10.0000 mg | ORAL_TABLET | Freq: Every day | ORAL | 1 refills | Status: DC

## 2016-08-14 NOTE — Patient Instructions (Addendum)
Jose Wheeler, Thank you for coming in to clinic today.  1. For your anxiety: - Resume lexapro 10 mg once daily. - START buspirone 5 mg twice daily.  - VISIT a Veterinary surgeon. Psych Counseling ONLY Self Referral: 1. [facility] [place] [facility]   Address: [address] [place], [place], [place] [address] Hours: Open today  9AM-7PM Phone: [phone]  2. Anell Barr [employer], [facility]  - [facility] Address: [address] [place], [place] [address] Phone: [phone]   BOTH [facility] + COUNSELING Self Referral [facility]) [place] [address], [place], [place] [address] Phone: [phone]  [employer], available walk-in 9am-4pm M-F [address] [place], [place] [address] Hours: 9am - 4pm (M-F, walk in available) [phone]  Please schedule a follow-up appointment with [name], AGNP to Return in about 4 weeks (around 09/11/2016) for anxiety med.  If you have any other questions or concerns, please feel free to call the clinic or send a message through MyChart. You may also schedule an earlier appointment if necessary.  [name], DNP, AGNP-BC Adult Gerontology Nurse Practitioner [facility], [facility]    Living With Anxiety After being diagnosed with an anxiety disorder, you may be relieved to know why you have felt or behaved a certain way. It is natural to also feel overwhelmed about the treatment ahead and what it will mean for your life. With care and support, you can manage this condition and recover from it. How to cope with anxiety Dealing with stress Stress is your body's reaction to life changes and events, both good and bad. Stress can last just a few hours or it can be ongoing. Stress can play a major role in anxiety, so it is important to learn both how to cope with stress and how to think about it differently. Talk with your health care provider or a counselor to learn more about stress  reduction. He or she may suggest some stress reduction techniques, such as:  Music therapy. This can include creating or listening to music that you enjoy and that inspires you.  Mindfulness-based meditation. This involves being aware of your normal breaths, rather than trying to control your breathing. It can be done while sitting or walking.  Centering prayer. This is a kind of meditation that involves focusing on a word, phrase, or sacred image that is meaningful to you and that brings you peace.  Deep breathing. To do this, expand your stomach and inhale slowly through your nose. Hold your breath for 3-5 seconds. Then exhale slowly, allowing your stomach muscles to relax.  Self-talk. This is a skill where you identify thought patterns that lead to anxiety reactions and correct those thoughts.  Muscle relaxation. This involves tensing muscles then relaxing them.  Choose a stress reduction technique that fits your lifestyle and personality. Stress reduction techniques take time and practice. Set aside 5-15 minutes a day to do them. Therapists can offer training in these techniques. The training may be covered by some insurance plans. Other things you can do to manage stress include:  Keeping a stress diary. This can help you learn what triggers your stress and ways to control your response.  Thinking about how you respond to certain situations. You may not be able to control everything, but you can control your reaction.  Making time for activities that help you relax, and not feeling guilty about spending your time in this way.  Therapy combined with  coping and stress-reduction skills provides the best chance for successful treatment. Medicines Medicines can help ease symptoms. Medicines for anxiety include:  Anti-anxiety drugs.  Antidepressants.  Beta-blockers.  Medicines may be used as the main treatment for anxiety disorder, along with therapy, or if other treatments are not  working. Medicines should be prescribed by a health care provider. Relationships Relationships can play a big part in helping you recover. Try to spend more time connecting with trusted friends and family members. Consider going to couples counseling, taking family education classes, or going to family therapy. Therapy can help you and others better understand the condition. How to recognize changes in your condition Everyone has a different response to treatment for anxiety. Recovery from anxiety happens when symptoms decrease and stop interfering with your daily activities at home or work. This may mean that you will start to:  Have better concentration and focus.  Sleep better.  Be less irritable.  Have more energy.  Have improved memory.  It is important to recognize when your condition is getting worse. Contact your health care provider if your symptoms interfere with home or work and you do not feel like your condition is improving. Where to find help and support: You can get help and support from these sources:  Self-help groups.  Online and [employer].  A trusted spiritual leader.  Couples counseling.  Family education classes.  Family therapy.  Follow these instructions at home:  Eat a healthy diet that includes plenty of vegetables, fruits, whole grains, low-fat dairy products, and lean protein. Do not eat a lot of foods that are high in solid fats, added sugars, or salt.  Exercise. Most adults should do the following: ? Exercise for at least 150 minutes each week. The exercise should increase your heart rate and make you sweat (moderate-intensity exercise). ? Strengthening exercises at least twice a week.  Cut down on caffeine, tobacco, alcohol, and other potentially harmful substances.  Get the right amount and quality of sleep. Most adults need 7-9 hours of sleep each night.  Make choices that simplify your life.  Take over-the-counter and  prescription medicines only as told by your health care provider.  Avoid caffeine, alcohol, and certain over-the-counter cold medicines. These may make you feel worse. Ask your pharmacist which medicines to avoid.  Keep all follow-up visits as told by your health care provider. This is important. Questions to ask your health care provider  Would I benefit from therapy?  How often should I follow up with a health care provider?  How long do I need to take medicine?  Are there any long-term side effects of my medicine?  Are there any alternatives to taking medicine? Contact a health care provider if:  You have a hard time staying focused or finishing daily tasks.  You spend many hours a day feeling worried about everyday life.  You become exhausted by worry.  You start to have headaches, feel tense, or have nausea.  You urinate more than normal.  You have diarrhea. Get help right away if:  You have a racing heart and shortness of breath.  You have thoughts of hurting yourself or others. If you ever feel like you may hurt yourself or others, or have thoughts about taking your own life, get help right away. You can go to your nearest emergency department or call:  Your local emergency services (911 in the [place]  A suicide crisis helpline, such as the National Suicide Prevention Lifeline at  (603)493-87181-959 197 8110. This is open 24-hours a day.  Summary  Taking steps to deal with stress can help calm you.  Medicines cannot cure anxiety disorders, but they can help ease symptoms.  Family, friends, and partners can play a big part in helping you recover from an anxiety disorder. This information is not intended to replace advice given to you by your health care provider. Make sure you discuss any questions you have with your health care provider. Document Released: 02/04/2016 Document Revised: 02/04/2016 Document Reviewed: 02/04/2016 Elsevier Interactive Patient Education  [employer]2018  Elsevier Inc.

## 2016-08-14 NOTE — Assessment & Plan Note (Addendum)
Anxiety is likely source of chest symptoms.  Normal ECG w/ same symptoms 1 year ago.  Last year, symptoms improved after starting lexapro 10 mg once daily. Recent worsening of symptoms w/ life stressors and return of chest symptoms and possible panic attacks.  Plan: 1. Reviewed non-pharm measures of mindfulness and meditation.  2. Encouraged 30 minutes exercise daily.  3. Continue lexapro 10 mg once daily. 4. START buspirone 5 mg tid. 5. START psychotherapy w/ counseling.  Self referral info provided. 6. Follow up 4 weeks.

## 2016-08-14 NOTE — Progress Notes (Signed)
Subjective:    Patient ID: Jose Wheeler, male    DOB: 05/08/1985, 31 y.o.   MRN: [id number]  Jose Wheeler is a 31 y.o. male presenting on 08/14/2016 for Follow-up (anxiety. Pt been off the Lexapro x 2 weeks. Chest pain, dizziness, and anxiety dealing with others.)   HPI Anxiety Pt was last seen for anxiety by Amy Krebs in May 2017.  He was started on lexapro 10 mg once daily and did not follow up for reassessment.  He has been off Lexapro x 2 weeks.  Sudden stop w/o taper because he ran out of medication w/o refills.    Currently, pt concerned for symptoms of chest tightness, sharp chest pains, dizziness, sensation of "needing more air."  Pt notes more frequency of these symptoms when stressed.  He had not had regular chest pains after starting the lexapro.  He had noticed over recent months that he didn't seem to have the same level of effect from his medication.  He reports having had constant level of anger/crankiness/irritability so much that he has become embarrassed about his disgruntled attitude.  Predominantly disgruntled w/ some happiness and some rage.  He had been tolerating Lexapro 10 mg once daily without side effects.  Denies SI/HI and has no plans to carry out if SI/HI arise.  Depression screen [facility] 2/9 08/14/2016 06/11/2016 02/08/2015  Decreased Interest 2 0 0  Down, Depressed, Hopeless 1 0 0  PHQ - 2 Score 3 0 0  Altered sleeping 3 - -  Tired, decreased energy 3 - -  Change in appetite 2 - -  Feeling bad or failure about yourself  1 - -  Trouble concentrating 1 - -  Moving slowly or fidgety/restless 2 - -  Suicidal thoughts 0 - -  PHQ-9 Score 15 - -  Difficult doing work/chores Somewhat difficult - -   GAD 7 : Generalized Anxiety Score 08/14/2016 07/16/2015  Nervous, Anxious, on Edge 2 2  Control/stop worrying 3 2  Worry too much - different things 2 3  Trouble relaxing 3 2  Restless 3 1  Easily annoyed or irritable 3 3  Afraid - awful might happen 0 0  Total GAD 7  Score 16 13  Anxiety Difficulty Very difficult Somewhat difficult    Social History  Substance Use Topics  . Smoking status: Never Smoker  . Smokeless tobacco: Never Used  . Alcohol use No    Review of Systems  Constitutional: Negative.   Gastrointestinal: Negative.   Neurological: Negative.   Psychiatric/Behavioral: Negative for self-injury and suicidal ideas.   Per HPI unless specifically indicated above     Objective:    BP 115/64 (BP Location: Right Arm, Patient Position: Sitting, Cuff Size: Large)   Pulse 83   Temp 98.7 F (37.1 C) (Oral)   Ht 5\' 8"  (1.727 m)   Wt 216 lb 12.8 oz (98.3 kg)   BMI 32.96 kg/m   Wt Readings from Last 3 Encounters:  08/14/16 216 lb 12.8 oz (98.3 kg)  06/11/16 214 lb (97.1 kg)  04/17/16 209 lb (94.8 kg)    Physical Exam  Constitutional: He is oriented to person, place, and time. He appears well-developed and well-nourished. No distress.  Cardiovascular: Normal rate, regular rhythm, normal heart sounds and intact distal pulses.  Exam reveals no gallop and no friction rub.   No murmur heard. Pulmonary/Chest: Effort normal and breath sounds normal. No respiratory distress.  Neurological: He is alert and oriented to person, place, and time.  He displays no tremor.  Skin: Skin is warm and dry.  Psychiatric: His behavior is normal. Judgment and thought content normal. His mood appears anxious. His speech is rapid and/or pressured. Cognition and memory are normal.  Vitals reviewed.   Results for orders placed or performed in visit on 02/08/15  Culture, Group A Strep  Result Value Ref Range   Strep A Culture Negative   POCT rapid strep A  Result Value Ref Range   Rapid Strep A Screen Negative Negative      Assessment & Plan:   Problem List Items Addressed This Visit      Other   Anxiety    Anxiety is likely source of chest symptoms.  Normal ECG w/ same symptoms 1 year ago.  Last year, symptoms improved after starting lexapro 10 mg  once daily. Recent worsening of symptoms w/ life stressors and return of chest symptoms and possible panic attacks.  Plan: 1. Reviewed non-pharm measures of mindfulness and meditation.  2. Encouraged 30 minutes exercise daily.  3. Continue lexapro 10 mg once daily. 4. START buspirone 5 mg tid. 5. START psychotherapy w/ counseling.  Self referral info provided. 6. Follow up 4 weeks.       Relevant Medications   busPIRone (BUSPAR) 5 MG tablet   escitalopram (LEXAPRO) 10 MG tablet      Meds ordered this encounter  Medications  . busPIRone (BUSPAR) 5 MG tablet    Sig: Take 1 tablet (5 mg total) by mouth 3 (three) times daily.    Dispense:  60 tablet    Refill:  1  . escitalopram (LEXAPRO) 10 MG tablet    Sig: Take 1 tablet (10 mg total) by mouth daily.    Dispense:  30 tablet    Refill:  1      Follow up plan: Return in about 4 weeks (around 09/11/2016) for anxiety med.  [name], DNP, AGPCNP-BC Adult Gerontology Primary Care Nurse Practitioner [facility] 08/21/2016, 4:53 PM

## 2016-08-21 NOTE — Progress Notes (Signed)
I have reviewed this encounter including the documentation in this note and/or discussed this patient with the provider, Wilhelmina McardleLauren Kennedy, AGPCNP-BC. I am certifying that I agree with the content of this note as supervising physician.  Saralyn PilarAlexander Elizette Shek, DO [facility] 08/21/2016, 5:21 PM

## 2016-09-11 ENCOUNTER — Ambulatory Visit (INDEPENDENT_AMBULATORY_CARE_PROVIDER_SITE_OTHER): Payer: 59 | Admitting: Nurse Practitioner

## 2016-09-11 ENCOUNTER — Encounter: Payer: Self-pay | Admitting: Nurse Practitioner

## 2016-09-11 VITALS — BP 130/72 | HR 91 | Temp 98.4°F | Ht 68.0 in | Wt 219.6 lb

## 2016-09-11 DIAGNOSIS — F419 Anxiety disorder, unspecified: Secondary | ICD-10-CM

## 2016-09-11 DIAGNOSIS — F5102 Adjustment insomnia: Secondary | ICD-10-CM | POA: Diagnosis not present

## 2016-09-11 MED ORDER — ESCITALOPRAM OXALATE 20 MG PO TABS: 20.0000 mg | ORAL_TABLET | Freq: Every day | ORAL | 4 refills | Status: DC

## 2016-09-11 MED ORDER — BUSPIRONE HCL 7.5 MG PO TABS: 7.5000 mg | ORAL_TABLET | Freq: Two times a day (BID) | ORAL | 5 refills | Status: DC

## 2016-09-11 NOTE — Progress Notes (Signed)
Subjective:    Patient ID: Jose Wheeler, male    DOB: 1985-04-23, 31 y.o.   MRN: [id number]  Jose Wheeler is a 31 y.o. male presenting on 09/11/2016 for Anxiety follow up of medication changes.   HPI  Anxiety At last visit, resumed escitalopram at 10 mg after being off for 1-2 weeks and started buspirone 5 mg tid w/ pt only taking buspar 5 mg once daily.  Pt notices more irritability in afternoon about 6 hours after taking buspirone.  Pt does notice improved irritability w/ fewer outbursts w/ family and coworkers.  Pt is having less anxiety and ruminations, but they persist.  Pt notes he now has less verbal filter than prior, but does have more interest in conversing w/ people than previously.  Desires to have more control over verbal outbursts.  Lexapro is working well, but doesn't notice much change in symptoms w/ same dose of 10 mg once daily.  He does not he has no more chest pains.    Now sleeps "a whole lot" - Doesn't feel rested after sleep.  Only seems to have felt rested after 10 hours.   Insomnia - Sleeps normally 6-7 hours nightly - interrupted.  - Works 2nd shift and sleep onset usually at 2am.  He denies difficulty falling asleep, but notes trouble staying asleep and returning to sleep once awake. He will have fewer interruptions once school resumes. Wife, children, animals in home all create noise disruptions for pt that have increased over summer months.    Social History  Substance Use Topics  . Smoking status: Never Smoker  . Smokeless tobacco: Never Used  . Alcohol use No    Review of Systems Per HPI unless specifically indicated above     Objective:    BP 130/72 (BP Location: Right Arm, Patient Position: Sitting, Cuff Size: Large)   Pulse 91   Temp 98.4 F (36.9 C) (Oral)   Ht 5\' 8"  (1.727 m)   Wt 219 lb 9.6 oz (99.6 kg)   BMI 33.39 kg/m   Wt Readings from Last 3 Encounters:  09/11/16 219 lb 9.6 oz (99.6 kg)  08/14/16 216 lb 12.8 oz (98.3 kg)  06/11/16 214  lb (97.1 kg)    Physical Exam  Constitutional: He is oriented to person, place, and time. He appears well-developed and well-nourished. No distress.  Tired appearing w/ dark circles under eyes.  HENT:  Head: Normocephalic and atraumatic.  Cardiovascular: Normal rate, regular rhythm, normal heart sounds and intact distal pulses.   Pulmonary/Chest: Effort normal and breath sounds normal. No respiratory distress.  Neurological: He is alert and oriented to person, place, and time.  Psychiatric: His behavior is normal. Judgment and thought content normal. His mood appears anxious. His speech is rapid and/or pressured and tangential. Cognition and memory are normal.   Results for orders placed or performed in visit on 02/08/15  Culture, Group A Strep  Result Value Ref Range   Strep A Culture Negative   POCT rapid strep A  Result Value Ref Range   Rapid Strep A Screen Negative Negative      Assessment & Plan:   Problem List Items Addressed This Visit      Other   Anxiety - Primary    Chest pain has resolved.  Resumed lexapro 10 mg once daily and is taking buspirone 5 mg once daily.  Panic has resolved, but pt still notes increased irritability and persistent anxiety.  Pt has not yet connected w/ a  counselor.  Plan: 1. Reviewed non-pharm measures of mindfulness and meditation.  2. Encouraged 30 minutes exercise daily.  3. Increase lexapro to 20 mg once daily. 4. Change buspirone to 7.5 mg bid.  Encouraged bid dosing. 5. START psychotherapy w/ counseling. 6. Follow up 4 weeks.       Relevant Medications   escitalopram (LEXAPRO) 20 MG tablet   busPIRone (BUSPAR) 7.5 MG tablet   Adjustment insomnia    Insomnia complicated by 2nd shift work and noisy home during sleep hours.  Pt currently gets frequently interrupted sleep and is in bed for about 10-12 hours.  Plan: 1. Encouraged sleep hygiene, darkened room, no use of electronic devices, white noise. 2. Can use melatonin 10 mg x 2  weeks, 5 mg x 1 week, then stop to assist in resetting circadian rhythm. 3. Follow up as needed.         Meds ordered this encounter  Medications  . escitalopram (LEXAPRO) 20 MG tablet    Sig: Take 1 tablet (20 mg total) by mouth daily.    Dispense:  30 tablet    Refill:  4    Order Specific Question:   Supervising Provider    Answer:   Smitty CordsKARAMALEGOS, ALEXANDER J [[id number]]  . busPIRone (BUSPAR) 7.5 MG tablet    Sig: Take 1 tablet (7.5 mg total) by mouth 2 (two) times daily.    Dispense:  60 tablet    Refill:  5    Order Specific Question:   Supervising Provider    Answer:   Smitty CordsKARAMALEGOS, ALEXANDER J [[id number]]      Follow up plan: Return in about 2 months (around 11/12/2016) for or sooner for blood pressure.  Pt will measure BP at home and come to clinic if > 140/90.  Wilhelmina McardleLauren Conlin Brahm, DNP, AGPCNP-BC Adult Gerontology Primary Care Nurse Practitioner [facility] [place] [facility] 09/14/2016, 7:05 AM

## 2016-09-11 NOTE — Patient Instructions (Addendum)
Jose Wheeler, Thank you for coming in to clinic today.  1. For sleep: - Melatonin 10 mg about 30 minutes before sleep for 2 weeks.  Then melatonin 5 mg for 1 week. Then stop. - Practice good sleep hygiene - Sleep Hygiene Tips  Take medicines only as directed by your health care provider.  Keep regular sleeping and waking hours. Avoid naps.  Keep a sleep diary to help you and your health care provider figure out what could be causing your insomnia. Include:  When you sleep.  When you wake up during the night.  How well you sleep.  How rested you feel the next day.  Any side effects of medicines you are taking.  What you eat and drink.  Make your bedroom a comfortable place where it is easy to fall asleep:  Put up shades or special blackout curtains to block light from outside.  Use a white noise machine to block noise.  Keep the temperature cool.  Exercise regularly as directed by your health care provider. Avoid exercising right before bedtime.  Use relaxation techniques to manage stress. Ask your health care provider to suggest some techniques that may work well for you. These may include:  Breathing exercises.  Routines to release muscle tension.  Visualizing peaceful scenes.  Cut back on alcohol, caffeinated beverages, and cigarettes, especially close to bedtime. These can disrupt your sleep.  Do not overeat or eat spicy foods right before bedtime. This can lead to digestive discomfort that can make it hard for you to sleep.  Limit screen use before bedtime. This includes:  Watching TV.  Using your smartphone, tablet, and computer.  Stick to a routine. This can help you fall asleep faster. Try to do a quiet activity, brush your teeth, and go to bed at the same time each night.  Get out of bed if you are still awake after 15 minutes of trying to sleep. Keep the lights down, but try reading or doing a quiet activity. When you feel sleepy, go back to bed.  Make  sure that you drive carefully. Avoid driving if you feel very sleepy.  Keep all follow-up appointments as directed by your health care provider. This is important.  Blackout Curtains for a dark bedroom.  White noise fan for blocking out daytime noise.   2. For your anxiety - Take 7.5 mg buspirone twice daily. Take one full pill w/ your new bottle.  Until then, take 1 and 1/2 pills of your buspirone 5 mg tablets. - Increase your escitalopram (Lexapro) to 20 mg once daily.   Psych Counseling ONLY  Self Referral: 1. Jose Wheeler [facility]   Address: [address], [place], [place] [address] Hours: Open today  9AM-7PM Phone: [phone]  2. Anell [facility]yl [facility] [employer], [facility]  - [facility] Address: [address], [place], [place] [address] Phone: [phone]    Please schedule a follow-up appointment with Wilhelmina McardleLauren Shamela Haydon, AGNP to Return in about 2 months (around 11/12/2016) for or sooner for blood pressure.  If you have any other questions or concerns, please feel free to call the clinic or send a message through MyChart. You may also schedule an earlier appointment if necessary.  Wilhelmina McardleLauren Wm Fruchter, DNP, AGNP-BC Adult Gerontology Nurse Practitioner [facility] [place] [facility], [facility]

## 2016-09-14 DIAGNOSIS — F5102 Adjustment insomnia: Secondary | ICD-10-CM | POA: Insufficient documentation

## 2016-09-14 NOTE — Assessment & Plan Note (Addendum)
Chest pain has resolved.  Resumed lexapro 10 mg once daily and is taking buspirone 5 mg once daily.  Panic has resolved, but pt still notes increased irritability and persistent anxiety.  Pt has not yet connected w/ a counselor.  Plan: 1. Reviewed non-pharm measures of mindfulness and meditation.  2. Encouraged 30 minutes exercise daily.  3. Increase lexapro to 20 mg once daily. 4. Change buspirone to 7.5 mg bid.  Encouraged bid dosing. 5. START psychotherapy w/ counseling. 6. Follow up 2 months.

## 2016-09-14 NOTE — Progress Notes (Signed)
I have reviewed this encounter including the documentation in this note and/or discussed this patient with the provider, Wilhelmina McardleLauren Kennedy, AGPCNP-BC. I am certifying that I agree with the content of this note as supervising physician.  Saralyn PilarAlexander Anokhi Shannon, DO [facility] 09/14/2016, 12:27 PM

## 2016-09-14 NOTE — Assessment & Plan Note (Signed)
Insomnia complicated by 2nd shift work and noisy home during sleep hours.  Pt currently gets frequently interrupted sleep and is in bed for about 10-12 hours.  Plan: 1. Encouraged sleep hygiene, darkened room, no use of electronic devices, white noise. 2. Can use melatonin 10 mg x 2 weeks, 5 mg x 1 week, then stop to assist in resetting circadian rhythm. 3. Follow up as needed.

## 2016-11-09 ENCOUNTER — Encounter: Payer: Self-pay | Admitting: Nurse Practitioner

## 2016-11-09 ENCOUNTER — Ambulatory Visit (INDEPENDENT_AMBULATORY_CARE_PROVIDER_SITE_OTHER): Payer: 59 | Admitting: Nurse Practitioner

## 2016-11-09 VITALS — BP 127/73 | HR 73 | Temp 98.2°F | Ht 68.0 in | Wt 221.2 lb

## 2016-11-09 DIAGNOSIS — J039 Acute tonsillitis, unspecified: Secondary | ICD-10-CM | POA: Diagnosis not present

## 2016-11-09 DIAGNOSIS — F419 Anxiety disorder, unspecified: Secondary | ICD-10-CM | POA: Diagnosis not present

## 2016-11-09 MED ORDER — ESCITALOPRAM OXALATE 20 MG PO TABS: 20.0000 mg | ORAL_TABLET | Freq: Every day | ORAL | 1 refills | Status: DC

## 2016-11-09 MED ORDER — AMOXICILLIN 500 MG PO TABS: 500.0000 mg | ORAL_TABLET | Freq: Two times a day (BID) | ORAL | 0 refills | Status: DC

## 2016-11-09 MED ORDER — BUSPIRONE HCL 7.5 MG PO TABS: 7.5000 mg | ORAL_TABLET | Freq: Two times a day (BID) | ORAL | 1 refills | Status: DC

## 2016-11-09 NOTE — Patient Instructions (Addendum)
Jose Wheeler, Thank you for coming in to clinic today.  1. For your medicine: - Continue buspirone 7.5 mg twice daily - Continue escitalopram 20 mg once daily.  2. Tonsillitis of right tonsil. - Take amoxicillin 500 mg twice daily for 7 days. - ENT referral if it does not improve.  Please call clinic for referral to be placed if needed.  Please schedule a follow-up appointment with [name], AGNP. Return in about 6 months (around 05/09/2017) for anxiety.  If you have any other questions or concerns, please feel free to call the clinic or send a message through MyChart. You may also schedule an earlier appointment if necessary.  You will receive a survey after today's visit either digitally by e-mail or paper by [employer]. Your experiences and feedback matter to [place].  Please respond so we know how we are doing as we provide care for you.   [name], DNP, AGNP-BC Adult Gerontology Nurse Practitioner [facility]

## 2016-11-09 NOTE — Assessment & Plan Note (Signed)
Continued improvement and now stable on current medication doses.  Pt has improved relationships w/ other people outside of home.  Is now connected w/ a counselor for marital counseling.    Plan: 1. Continue lexapro to 20 mg once daily. 2. Continue buspirone to 7.5 mg bid.  3. Continue psychotherapy w/ counseling. 4. Follow up 6 months.

## 2016-11-09 NOTE — Progress Notes (Signed)
Subjective:    Patient ID: Jose Wheeler, male    DOB: 1985/03/25, 31 y.o.   MRN: [id number]  Antoni Stefan is a 31 y.o. male presenting on 11/09/2016 for Anxiety   HPI Anxiety Pt has had improvement on current medications and doses.  Has had change in mood slowly, but most significant improvement is in his reduced irritablity w/ other people.    He did run out of medication for 2 weeks and felt like "every thought was irritating" when not on medication.  Didn't want to get out bed.  Ran out and forgot to call for refill.  Is sleeping 8 hours during day now and is feeling much better.  Trying to switch to day shift to further improve sleep habits and time with family.   Pt asks about side effects: is having sweating and feeling of reduced electrolytes.  Drinking 1 gatorade daily and feeling significantly improved.  Swollen Right Tonsil Sensation of something stuck to back of throat that "cannot come loose." Denies sore throat, pain w/ swallowing, fever, chills, sweats, other recent URI illness.    Social History  Substance Use Topics  . Smoking status: Never Smoker  . Smokeless tobacco: Never Used  . Alcohol use No    Review of Systems Per HPI unless specifically indicated above     Objective:    BP 127/73 (BP Location: Right Arm, Patient Position: Sitting, Cuff Size: Large)   Pulse 73   Temp 98.2 F (36.8 C) (Oral)   Ht  (1.727 m)   Wt 221 lb 3.2 oz (100.3 kg)   BMI 33.63 kg/m   Wt Readings from Last 3 Encounters:  11/09/16 221 lb 3.2 oz (100.3 kg)  09/11/16 219 lb 9.6 oz (99.6 kg)  08/14/16 216 lb 12.8 oz (98.3 kg)    Physical Exam  Constitutional: He is oriented to person, place, and time. He appears well-developed and well-nourished.  HENT:  Head: Normocephalic and atraumatic.  Right Ear: Hearing, tympanic membrane, external ear and ear canal normal.  Left Ear: Hearing, tympanic membrane, external ear and ear canal normal.  Nose: Nose normal. Right sinus  exhibits no maxillary sinus tenderness and no frontal sinus tenderness. Left sinus exhibits no maxillary sinus tenderness and no frontal sinus tenderness.  Mouth/Throat: Uvula is midline and mucous membranes are normal.    Mallampati Score 1 - Complete visualization of entire oropharynx soft palate  Cardiovascular: Normal rate, regular rhythm and normal heart sounds.   Neurological: He is alert and oriented to person, place, and time.  No tremor  Skin: Skin is warm. He is not diaphoretic.  Psychiatric: He has a normal mood and affect. His speech is normal and behavior is normal. Judgment and thought content normal. Cognition and memory are normal.  Speech much less pressured and tangential.      Results for orders placed or performed in visit on 02/08/15  Culture, Group A Strep  Result Value Ref Range   Strep A Culture Negative   POCT rapid strep A  Result Value Ref Range   Rapid Strep A Screen Negative Negative      Assessment & Plan:   Problem List Items Addressed This Visit      Other   Anxiety    Continued improvement and now stable on current medication doses.  Pt has improved relationships w/ other people outside of home.  Is now connected w/ a counselor for marital counseling.    Plan: 1. Continue lexapro to 20  mg once daily. 2. Continue buspirone to 7.5 mg bid.  3. Continue psychotherapy w/ counseling. 4. Follow up 6 months.       Relevant Medications   busPIRone (BUSPAR) 7.5 MG tablet   escitalopram (LEXAPRO) 20 MG tablet    Other Visit Diagnoses    Tonsillitis with exudate    -  Primary   Acute infection w/o systemic symptoms or other focal infection.  No airway involvement.  Edema +1.  Plan: 1. Amoxicillin 500 mg bid x 7d 2. Refer to ENT prn   Relevant Medications   amoxicillin (AMOXIL) 500 MG tablet      Meds ordered this encounter  Medications  . busPIRone (BUSPAR) 7.5 MG tablet    Sig: Take 1 tablet (7.5 mg total) by mouth 2 (two) times daily.     Dispense:  180 tablet    Refill:  1  . escitalopram (LEXAPRO) 20 MG tablet    Sig: Take 1 tablet (20 mg total) by mouth daily.    Dispense:  90 tablet    Refill:  1      Follow up plan: Return in about 6 months (around 05/09/2017) for anxiety.  [name], DNP, AGPCNP-BC Adult Gerontology Primary Care Nurse Practitioner [facility] 11/09/2016, 9:54 AM

## 2016-11-12 ENCOUNTER — Other Ambulatory Visit: Payer: Self-pay

## 2016-11-12 DIAGNOSIS — J039 Acute tonsillitis, unspecified: Secondary | ICD-10-CM

## 2016-11-12 MED ORDER — AMOXICILLIN 500 MG PO TABS: 500.0000 mg | ORAL_TABLET | Freq: Two times a day (BID) | ORAL | 0 refills | Status: AC

## 2016-11-30 ENCOUNTER — Telehealth: Payer: Self-pay | Admitting: Nurse Practitioner

## 2016-11-30 MED ORDER — AMOXICILLIN-POT CLAVULANATE 875-125 MG PO TABS: 1.0000 | ORAL_TABLET | Freq: Two times a day (BID) | ORAL | 0 refills | Status: AC

## 2016-11-30 NOTE — Addendum Note (Signed)
Addended by: [name] R on: 11/30/2016 04:01 PM   Modules accepted: Orders

## 2016-11-30 NOTE — Telephone Encounter (Signed)
I called the pt and he informed me that he finished his abx for his tonsillitis, but his throat is still sore and now he has a blister or something on his tonsils. He want to know what is your recommendation.

## 2016-11-30 NOTE — Telephone Encounter (Signed)
I will send augmentin bid x 10 days to repeat treatment.  Will need to have ENT referral if recurs after this course of antibioitics.

## 2016-11-30 NOTE — Telephone Encounter (Signed)
The pt was notified. No questions or concerns. He verbalize understanding.  

## 2016-11-30 NOTE — Telephone Encounter (Signed)
Pt calles stat that he finished his medication last week states that he had a blister wanted to know if you would call something in for him. Pt call back # is  [phone]

## 2016-12-07 DIAGNOSIS — J039 Acute tonsillitis, unspecified: Secondary | ICD-10-CM | POA: Diagnosis not present

## 2016-12-07 DIAGNOSIS — R0981 Nasal congestion: Secondary | ICD-10-CM | POA: Diagnosis not present

## 2016-12-15 DIAGNOSIS — J0391 Acute recurrent tonsillitis, unspecified: Secondary | ICD-10-CM | POA: Diagnosis not present

## 2016-12-15 DIAGNOSIS — J3501 Chronic tonsillitis: Secondary | ICD-10-CM | POA: Diagnosis not present

## 2016-12-15 DIAGNOSIS — J351 Hypertrophy of tonsils: Secondary | ICD-10-CM | POA: Diagnosis not present

## 2017-03-22 ENCOUNTER — Other Ambulatory Visit: Payer: Self-pay

## 2017-03-22 ENCOUNTER — Encounter: Payer: Self-pay | Admitting: Nurse Practitioner

## 2017-03-22 ENCOUNTER — Ambulatory Visit: Payer: 59 | Admitting: Nurse Practitioner

## 2017-03-22 VITALS — BP 129/70 | HR 84 | Temp 98.7°F | Ht 68.0 in | Wt 230.6 lb

## 2017-03-22 DIAGNOSIS — G5603 Carpal tunnel syndrome, bilateral upper limbs: Secondary | ICD-10-CM

## 2017-03-22 MED ORDER — PREDNISONE 10 MG PO TABS: ORAL_TABLET | ORAL | 0 refills | Status: DC

## 2017-03-22 NOTE — Progress Notes (Signed)
Subjective:    Patient ID: Jose Wheeler, male    DOB: 02/16/1986, 32 y.o.   MRN: [id number]  Jose Wheeler is a 32 y.o. male presenting on 03/22/2017 for Numbness (numbness & tingling in the bilateral hands  x 1 mth. Sometimes the numbness and pain radiates down the forearm and in the elbow. Symptoms worsen with movement, but sometimes disturb his sleep. )   HPI Bilateral hand numbness Pt reports today for evaluation of bilateral hand numbness. Numbness is mostly noted in bilateral hands from his thumb to the lateral 1/2 of the 4th finger.  It also affects his forearm from elbows down to fingers.  The numbness is awakening him at night and is described by pt as pain. Pt is noticing the symptoms in all sleeping positions, side-lying, supine w/ arms on chest and above his head without change in symptoms.  Symptoms have occurred for last 4 weeks.  May have occurred prior, but pt was not as acutely aware of symptoms.  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  He continues to work with small tubes/pipettes.  It is affecting his work as pt notes his hands would go numb and couldn't feel items in hands.  He notes his work is done at chest-high level without significant stooping or raised arms.     Pt notes no neck or shoulder pain, but notes he does shrug his shoulders habitually and finds himself in a stance with poor/hunched posture.    Pt has no radiating/sharp shooting pain down his arms.  Has not taken any medications or done any other treatment for his symptoms.  Notes that rubbing from elbows toward his fingertips with his alternate hand helps his symptoms.  Pt reports regularly using gaming controllers and phone outside of work which also worsens his numbness.  Social History   Tobacco Use  . Smoking status: Never Smoker  . Smokeless tobacco: Never Used  Substance Use Topics  . Alcohol use: No    Alcohol/week: 0.0 oz  . Drug  use: No    Review of Systems Per HPI unless specifically indicated above     Objective:    BP 129/70 (BP Location: Right Arm, Patient Position: Sitting, Cuff Size: Large)   Pulse 84   Temp 98.7 F (37.1 C) (Oral)   Ht 5\' 8"  (1.727 m)   Wt 230 lb 9.6 oz (104.6 kg)   BMI 35.06 kg/m   Wt Readings from Last 3 Encounters:  03/22/17 230 lb 9.6 oz (104.6 kg)  11/09/16 221 lb 3.2 oz (100.3 kg)  09/11/16 219 lb 9.6 oz (99.6 kg)    Physical Exam  General - overweight, well-appearing, NAD HEENT - Normocephalic, atraumatic Heart - RRR, no murmurs heard Lungs - Clear throughout all lobes, no wheezing, crackles, or rhonchi. Normal work of breathing. Extremeties - non-tender, no edema, cap refill < 2 seconds, peripheral pulses intact +2 bilaterally Musculoskeletal - Bilateral Hand/Wrist Inspection: Normal appearance, symmetrical, no bulky MCP joints, no edema or erythema. Palpation: Non tender hand / wrist, carpal bones, including MCP, base of thumb. No distinct anatomical snuff box or scaphoid tenderness.  ROM: full active wrist ROM flex / ext, ulnar / radial deviation Special Testing: Positive Tinel's median nerve test and phalen test Strength: 5/5 grip, thumb opposition, wrist flex/ext Neurovascular: distally intact Skin - warm, dry Neuro - awake, alert, oriented x3, normal gait Psych - Normal mood and affect, normal behavior  Results for orders placed or performed in visit on 02/08/15  Culture, Group A Strep  Result Value Ref Range   Strep A Culture Negative   POCT rapid strep A  Result Value Ref Range   Rapid Strep A Screen Negative Negative      Assessment & Plan:   Problem List Items Addressed This Visit    None    Visit Diagnoses    Bilateral carpal tunnel syndrome    -  Primary Clinically most consistent with acute on chronic bilateral hand carpal tunnel syndrome. History and exam are not suggestive of other nerve impingement or complication. 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 Discussed carpal tunnel syndrome, treatment, prognosis, complications and likely long-term management of symptoms will be required 2. - START prednisone taper over 7 days Day 1-2: 60 mg, Day 3: 50 mg, Day 4: 40 mg; Day 5: 30 mg; Day 6: 20 mg; Day 7: 10 mg then stop. - HOLD NSAID while on prednisone, may start when finished - Take Tylenol PRN breakthrough 3. Recommend wrist splint support (avoid flex/ext repetition) wear overnight to bed, may use during day with excessive repetitive activities 4. Discussed next steps: pt may need injection or other approach.  Referral to Orthopedics for further evaluation.  5. Follow-up within 4-6 weeks if not improved and not seen previously by Ortho   Relevant Medications   predniSONE (DELTASONE) 10 MG tablet   Other Relevant Orders   Ambulatory referral to Orthopedic Surgery      Meds ordered this encounter  Medications  . predniSONE (DELTASONE) 10 MG tablet    Sig: Day 1-2 take 6 pills. Day 3 take 5 pills then reduce by 1 pill each day.    Dispense:  27 tablet    Refill:  0    Order Specific Question:   Supervising Provider    Answer:   Smitty Cords [[id number]]   Follow up plan: Return  6 weeks as needed if symptoms worsen or fail to improve.  [name], DNP, AGPCNP-BC Adult Gerontology Primary Care Nurse Practitioner [facility] 03/22/2017, 1:28 PM

## 2017-03-22 NOTE — Patient Instructions (Addendum)
Jose Wheeler, Thank you for coming in to clinic today.  1. You very likely have carpal tunnel syndrome. Take prednisone taper 10 mg tablets for 7 days. Day 1 (Today): Take 6 pills at one time Day 2: Take 6 pills  Day 3: Take 5 pills Day 4: Take 4 pills Day 5: Take 3 pills Day 6: Take 2 pills Day 7: Take 1 pill then stop.  - Start taking Tylenol extra strength 1 to 2 tablets every 6-8 hours for aches or fever/chills for next few days as needed.  Do not take more than 3,000 mg in 24 hours from all medicines.  May take Ibuprofen as well if tolerated 200-400mg  every 8 hours as needed. May alternate tylenol and ibuprofen in same day. - Use heat and ice.  Apply this for 15 minutes at a time 6-8 times per day.   - Muscle rub with lidocaine, lidocaine patch, Biofreeze, or tiger balm for topical pain relief.  Avoid using this with heat and ice to avoid burns.   Please schedule a follow-up appointment with Wilhelmina McardleLauren Shiloh [employer], AGNP. Return  6 weeks as needed if symptoms worsen or fail to improve.   If you have any other questions or concerns, please feel free to call the clinic or send a message through MyChart. You may also schedule an earlier appointment if necessary.  You will receive a survey after today's visit either digitally by e-mail or paper by [employer] mail. Your experiences and feedback matter to us.  Please respond so we know how we are doing as we provide care for you.   Wilhelmina McardleLauren Kailand Seda, DNP, AGNP-BC Adult Gerontology Nurse Practitioner [facility], [facility]   Carpal Tunnel Syndrome Carpal tunnel syndrome is a condition that causes pain in your hand and arm. The carpal tunnel is a narrow area located on the palm side of your wrist. Repeated wrist motion or certain diseases may cause swelling within the tunnel. This swelling pinches the main nerve in the wrist (median nerve). What are the causes? This condition may be caused by:  Repeated wrist motions.  Wrist  injuries.  Arthritis.  A cyst or tumor in the carpal tunnel.  Fluid buildup during pregnancy.  Sometimes the cause of this condition is not known. What increases the risk? This condition is more likely to develop in:  People who have jobs that cause them to repeatedly move their wrists in the same motion, such as Health visitorbutchers and cashiers.  Women.  People with certain conditions, such as: ? Diabetes. ? Obesity. ? An underactive thyroid (hypothyroidism). ? Kidney failure.  What are the signs or symptoms? Symptoms of this condition include:  A tingling feeling in your fingers, especially in your thumb, index, and middle fingers.  Tingling or numbness in your hand.  An aching feeling in your entire arm, especially when your wrist and elbow are bent for long periods of time.  Wrist pain that goes up your arm to your shoulder.  Pain that goes down into your palm or fingers.  A weak feeling in your hands. You may have trouble grabbing and holding items.  Your symptoms may feel worse during the night. How is this diagnosed? This condition is diagnosed with a medical history and physical exam. You may also have tests, including:  An electromyogram (EMG). This test measures electrical signals sent by your nerves into the muscles.  X-rays.  How is this treated? Treatment for this condition includes:  Lifestyle changes. It is important to stop  doing or modify the activity that caused your condition.  Physical or occupational therapy.  Medicines for pain and inflammation. This may include medicine that is injected into your wrist.  A wrist splint.  Surgery.  Follow these instructions at home: If you have a splint:  Wear it as told by your health care provider. Remove it only as told by your health care provider.  Loosen the splint if your fingers become numb and tingle, or if they turn cold and blue.  Keep the splint clean and dry. General instructions  Take  over-the-counter and prescription medicines only as told by your health care provider.  Rest your wrist from any activity that may be causing your pain. If your condition is work related, talk to your employer about changes that can be made, such as getting a wrist pad to use while typing.  If directed, apply ice to the painful area: ? Put ice in a plastic bag. ? Place a towel between your skin and the bag. ? Leave the ice on for 20 minutes, 2-3 times per day.  Keep all follow-up visits as told by your health care provider. This is important.  Do any exercises as told by your health care provider, physical therapist, or occupational therapist. Contact a health care provider if:  You have new symptoms.  Your pain is not controlled with medicines.  Your symptoms get worse. This information is not intended to replace advice given to you by your health care provider. Make sure you discuss any questions you have with your health care provider. Document Released: 02/07/2000 Document Revised: 06/20/2015 Document Reviewed: 06/27/2014 Elsevier Interactive Patient Education  [employer].

## 2017-03-25 DIAGNOSIS — G5603 Carpal tunnel syndrome, bilateral upper limbs: Secondary | ICD-10-CM | POA: Diagnosis not present

## 2017-03-26 ENCOUNTER — Telehealth: Payer: Self-pay | Admitting: Student

## 2017-03-26 NOTE — Telephone Encounter (Signed)
Mindy with [employer] had a question about pt's workers comp and last visit [phone]

## 2017-03-26 NOTE — Telephone Encounter (Signed)
Incoming phone call

## 2017-03-29 ENCOUNTER — Ambulatory Visit (INDEPENDENT_AMBULATORY_CARE_PROVIDER_SITE_OTHER): Payer: 59 | Admitting: Nurse Practitioner

## 2017-03-29 ENCOUNTER — Other Ambulatory Visit: Payer: Self-pay

## 2017-03-29 ENCOUNTER — Encounter: Payer: Self-pay | Admitting: Nurse Practitioner

## 2017-03-29 VITALS — BP 141/87 | HR 86 | Temp 98.4°F | Ht 68.0 in | Wt 231.4 lb

## 2017-03-29 DIAGNOSIS — Z23 Encounter for immunization: Secondary | ICD-10-CM

## 2017-03-29 DIAGNOSIS — Z0000 Encounter for general adult medical examination without abnormal findings: Secondary | ICD-10-CM | POA: Diagnosis not present

## 2017-03-29 NOTE — Patient Instructions (Addendum)
Jose Wheeler, Thank you for coming in to clinic today.  1. Measure your food on a plate, package .  Please schedule a follow-up appointment with [name], AGNP. Return in about 1 year (around 03/29/2018) for annual physical.  If you have any other questions or concerns, please feel free to call the clinic or send a message through MyChart. You may also schedule an earlier appointment if necessary.  You will receive a survey after today's visit either digitally by e-mail or paper by [employer]. Your experiences and feedback matter to [place].  Please respond so we know how we are doing as we provide care for you.   [name], DNP, AGNP-BC Adult Gerontology Nurse Practitioner [facility], [facility]   Serving Sizes A serving size is a measured amount of food or drink, such as one slice of bread, that has an associated nutrient content. Knowing the serving size of a food or drink can help you determine how much of that food you should consume. What is the size of one serving? The size of one healthy serving depends on the food or drink. To determine a serving size, read the food label. If the food or drink does not have a food label, try to find serving size information online. Or, use the following to estimate the size of one adult serving: Grain 1 slice bread.  bagel.  cup pasta. Vegetable  cup cooked or canned vegetables. 1 cup raw, leafy greens. Fruit  cup canned fruit. 1 medium fruit.  cup dried fruit. Meat and Other Protein Sources 1 oz meat, poultry, or fish.  cup cooked beans. 1 egg.  cup nuts or seeds. 1 Tbsp nut butter.  cup tofu or tempeh. 2 Tbsp hummus. Dairy An individual container of yogurt (6-8 oz). 1 piece of cheese the size of your thumb (1 oz). 1 cup (8 oz) milk or milk alternative. Fat A piece the size of one dice. 1 tsp soft margarine. 1 Tbsp mayonnaise. 1 tsp vegetable oil. 1 Tbsp regular salad dressing. 2 Tbsp low-fat salad dressing. How many  servings should I eat from each food group each day? The following are the suggested number of servings to try and have every day from each food group. You can also look at your eating throughout the week and aim for meeting these requirements on most days for overall healthy eating. Grain 6-8 servings. Try to have half of your grains from whole grains, such as whole wheat bread, corn tortillas, oatmeal, brown rice, whole wheat pasta, and bulgur. Vegetable At least 2-3 servings. Fruit 2 servings. Meat and Other Protein Foods 5-6 servings. Aim to have lean proteins, such as chicken, [place], fish, beans, or tofu. Dairy 3 servings. Choose low-fat or nonfat if you are trying to control your weight. Fat 2-3 servings. Is a serving the same thing as a portion? No. A portion is the actual amount you eat, which may be more than one serving. Knowing the specific serving size of a food and the nutritional information that goes with it can help you make a healthy decision on what size portion to eat. What are some tips to help me learn healthy serving sizes?  Check food labels for serving sizes. Many foods that come as a single portion actually contain multiple servings.  Determine the serving size of foods you commonly eat and figure out how large a portion you usually eat.  Measure the number of servings that can be held by the bowls,  glasses, cups, and plates you typically use. For example, pour your breakfast cereal into your regular bowl and then pour it into a measuring cup.  For 1-2 days, measure the serving sizes of all the foods you eat.  Practice estimating serving sizes and determining how big your portions should be. This information is not intended to replace advice given to you by your health care provider. Make sure you discuss any questions you have with your health care provider. Document Released: 11/08/2002 Document Revised: 10/05/2015 Document Reviewed: 05/09/2013 Elsevier  Interactive Patient Education  [employer]2018 [employer].

## 2017-03-29 NOTE — Addendum Note (Signed)
Addended by: Vernard GamblesKENNEDY, Suhaan Perleberg R on: 03/29/2017 04:53 PM   Modules accepted: Level of Service

## 2017-03-29 NOTE — Progress Notes (Signed)
Subjective:    Patient ID: Jose Wheeler, male    DOB: [birth year], 32 y.o.   MRN: [id number]  Jose Wheeler is a 32 y.o. male presenting on 03/29/2017 for Annual Exam   HPI Annual Physical Exam  Patient has been feeling well.  They have no acute concerns today. Sleeps 7-8 hours per night uninterrupted and is now working dayshift.  Reported snoring.    HEALTH MAINTENANCE:   Weight/BMI: worsening Physical activity: walking dogs 20-30 minutes daily, intermittent and not rapid walking Diet: single 20 oz coca-cola daily, portion control is another struggle - eats too much.  Cereal in am x 2-3 servings (high sugar, lactaid milk)   Caffeine: 20 oz coca-cola plus 270 mg caffeine energy shot x 1.  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 Carpal tunnel syndrome   . Obesity    Past Surgical History:  Procedure Laterality Date  . NO PAST SURGERIES     Social History   Socioeconomic History  . Marital status: Married    Spouse name: Not on file  . Number of children: Not on file  . Years of education: Not on file  . Highest education level: Not on file  Social Needs  . Financial resource strain: Not hard at all  . Food insecurity - worry: Never true  . Food insecurity - inability: Never true  . Transportation needs - medical: No  . Transportation needs - non-medical: No  Occupational History  . Occupation: Building control surveyor: [employer]  Tobacco Use  . Smoking status: Never Smoker  . Smokeless tobacco: Never Used  Substance and Sexual Activity  . Alcohol use: No    Alcohol/week: 0.0 oz  . Drug use: No  . Sexual activity: Yes  Other Topics Concern  . Not on file  Social History Narrative  . Not on file   History reviewed. No pertinent family history. Current Outpatient Medications on File Prior to  Visit  Medication Sig  . busPIRone (BUSPAR) 7.5 MG tablet Take 1 tablet (7.5 mg total) by mouth 2 (two) times daily.  Marland Kitchen escitalopram (LEXAPRO) 20 MG tablet Take 1 tablet (20 mg total) by mouth daily.  . predniSONE (DELTASONE) 10 MG tablet Day 1-2 take 6 pills. Day 3 take 5 pills then reduce by 1 pill each day. (Patient not taking: Reported on 03/29/2017)   No current facility-administered medications on file prior to visit.     Review of Systems Per HPI unless specifically indicated above     Objective:    BP (!) 141/87 (BP Location: Right Arm, Patient Position: Sitting, Cuff Size: Large)   Pulse 86   Temp 98.4 F (36.9 C) (Oral)   Ht 5\' 8"  (1.727 m)   Wt 231 lb 6.4 oz (105 kg)   BMI 35.18 kg/m   BP recheck improved 130/74  Wt Readings from Last 3 Encounters:  03/29/17 231 lb 6.4 oz (105 kg)  03/22/17 230 lb 9.6 oz (104.6 kg)  11/09/16 221 lb 3.2 oz (100.3 kg)     Physical Exam  General - obese, well-appearing, NAD HEENT - Normocephalic, atraumatic, PERRL, EOMI, patent nares w/o congestion, oropharynx clear, MMM Neck - supple, non-tender, no LAD, no thyromegaly Heart - RRR, no murmurs heard Lungs - Clear throughout all lobes, no wheezing, crackles, or  rhonchi. Normal work of breathing. Abdomen - soft, NTND, no masses, no hepatosplenomegaly, active bowel sounds Extremeties - non-tender, no edema, cap refill < 2 seconds, peripheral pulses intact +2 bilaterally Skin - warm, dry, no rashes Neuro - awake, alert, oriented x3, CN II-X intact, intact muscle strength 5/5 bilaterally, intact distal sensation to light touch, normal coordination, normal gait, DTR normal. Psych - Normal mood and affect, normal behavior   Results for orders placed or performed in visit on 02/08/15  Culture, Group A Strep  Result Value Ref Range   Strep A Culture Negative   POCT rapid strep A  Result Value Ref Range   Rapid Strep A Screen Negative Negative      Assessment & Plan:   Problem List  Items Addressed This Visit    None    Visit Diagnoses    Annual physical exam    -  Primary Physical exam with no new findings.  Well adult with no acute concerns.  Carpal tunnel is improving some with splints at night.    Plan: 1. Obtain health maintenance screenings. 2. Increase physical activity to 30 minutes most days of the week. 3. Return 1 year for annual physical.   Relevant Orders   CBC with Differential/Platelet   HIV antibody   Comprehensive metabolic panel   Lipid panel   VITAMIN D 25 Hydroxy (Vit-D Deficiency, Fractures)   Need for immunization against influenza       Relevant Orders   Flu Vaccine QUAD 6+ mos PF IM (Fluarix Quad PF)       Follow up plan: Return in about 1 year (around 03/29/2018) for annual physical.  Wilhelmina McardleLauren Rylon Poitra, DNP, AGPCNP-BC Adult Gerontology Primary Care Nurse Practitioner [facility] 03/29/2017, 9:46 AM

## 2017-04-12 DIAGNOSIS — G5602 Carpal tunnel syndrome, left upper limb: Secondary | ICD-10-CM | POA: Diagnosis not present

## 2017-04-12 DIAGNOSIS — G5601 Carpal tunnel syndrome, right upper limb: Secondary | ICD-10-CM | POA: Diagnosis not present

## 2017-04-12 DIAGNOSIS — M9907 Segmental and somatic dysfunction of upper extremity: Secondary | ICD-10-CM | POA: Diagnosis not present

## 2017-04-13 DIAGNOSIS — G5602 Carpal tunnel syndrome, left upper limb: Secondary | ICD-10-CM | POA: Diagnosis not present

## 2017-04-13 DIAGNOSIS — G5601 Carpal tunnel syndrome, right upper limb: Secondary | ICD-10-CM | POA: Diagnosis not present

## 2017-04-13 DIAGNOSIS — M9907 Segmental and somatic dysfunction of upper extremity: Secondary | ICD-10-CM | POA: Diagnosis not present

## 2017-04-15 DIAGNOSIS — M9907 Segmental and somatic dysfunction of upper extremity: Secondary | ICD-10-CM | POA: Diagnosis not present

## 2017-04-15 DIAGNOSIS — G5602 Carpal tunnel syndrome, left upper limb: Secondary | ICD-10-CM | POA: Diagnosis not present

## 2017-04-15 DIAGNOSIS — G5601 Carpal tunnel syndrome, right upper limb: Secondary | ICD-10-CM | POA: Diagnosis not present

## 2017-04-19 DIAGNOSIS — M9907 Segmental and somatic dysfunction of upper extremity: Secondary | ICD-10-CM | POA: Diagnosis not present

## 2017-04-19 DIAGNOSIS — G5601 Carpal tunnel syndrome, right upper limb: Secondary | ICD-10-CM | POA: Diagnosis not present

## 2017-04-19 DIAGNOSIS — G5602 Carpal tunnel syndrome, left upper limb: Secondary | ICD-10-CM | POA: Diagnosis not present

## 2017-04-20 DIAGNOSIS — G5601 Carpal tunnel syndrome, right upper limb: Secondary | ICD-10-CM | POA: Diagnosis not present

## 2017-04-20 DIAGNOSIS — G5602 Carpal tunnel syndrome, left upper limb: Secondary | ICD-10-CM | POA: Diagnosis not present

## 2017-04-20 DIAGNOSIS — M9907 Segmental and somatic dysfunction of upper extremity: Secondary | ICD-10-CM | POA: Diagnosis not present

## 2017-04-26 ENCOUNTER — Other Ambulatory Visit: Payer: Self-pay | Admitting: Nurse Practitioner

## 2017-04-26 DIAGNOSIS — F419 Anxiety disorder, unspecified: Secondary | ICD-10-CM

## 2017-04-26 DIAGNOSIS — M9907 Segmental and somatic dysfunction of upper extremity: Secondary | ICD-10-CM | POA: Diagnosis not present

## 2017-04-26 DIAGNOSIS — G5601 Carpal tunnel syndrome, right upper limb: Secondary | ICD-10-CM | POA: Diagnosis not present

## 2017-04-26 DIAGNOSIS — G5602 Carpal tunnel syndrome, left upper limb: Secondary | ICD-10-CM | POA: Diagnosis not present

## 2017-04-26 MED ORDER — ESCITALOPRAM OXALATE 20 MG PO TABS: 20.0000 mg | ORAL_TABLET | Freq: Every day | ORAL | 1 refills | Status: DC

## 2017-04-27 DIAGNOSIS — G5601 Carpal tunnel syndrome, right upper limb: Secondary | ICD-10-CM | POA: Diagnosis not present

## 2017-04-27 DIAGNOSIS — G5602 Carpal tunnel syndrome, left upper limb: Secondary | ICD-10-CM | POA: Diagnosis not present

## 2017-04-27 DIAGNOSIS — M9907 Segmental and somatic dysfunction of upper extremity: Secondary | ICD-10-CM | POA: Diagnosis not present

## 2017-04-29 DIAGNOSIS — G5601 Carpal tunnel syndrome, right upper limb: Secondary | ICD-10-CM | POA: Diagnosis not present

## 2017-04-29 DIAGNOSIS — G5602 Carpal tunnel syndrome, left upper limb: Secondary | ICD-10-CM | POA: Diagnosis not present

## 2017-04-29 DIAGNOSIS — M9907 Segmental and somatic dysfunction of upper extremity: Secondary | ICD-10-CM | POA: Diagnosis not present

## 2017-05-03 DIAGNOSIS — M9907 Segmental and somatic dysfunction of upper extremity: Secondary | ICD-10-CM | POA: Diagnosis not present

## 2017-05-03 DIAGNOSIS — G5601 Carpal tunnel syndrome, right upper limb: Secondary | ICD-10-CM | POA: Diagnosis not present

## 2017-05-03 DIAGNOSIS — G5602 Carpal tunnel syndrome, left upper limb: Secondary | ICD-10-CM | POA: Diagnosis not present

## 2017-05-07 DIAGNOSIS — G5601 Carpal tunnel syndrome, right upper limb: Secondary | ICD-10-CM | POA: Diagnosis not present

## 2017-05-07 DIAGNOSIS — M9907 Segmental and somatic dysfunction of upper extremity: Secondary | ICD-10-CM | POA: Diagnosis not present

## 2017-05-07 DIAGNOSIS — G5602 Carpal tunnel syndrome, left upper limb: Secondary | ICD-10-CM | POA: Diagnosis not present

## 2017-05-10 ENCOUNTER — Ambulatory Visit: Payer: 59 | Admitting: Nurse Practitioner

## 2017-05-10 DIAGNOSIS — G5602 Carpal tunnel syndrome, left upper limb: Secondary | ICD-10-CM | POA: Diagnosis not present

## 2017-05-10 DIAGNOSIS — M9907 Segmental and somatic dysfunction of upper extremity: Secondary | ICD-10-CM | POA: Diagnosis not present

## 2017-05-10 DIAGNOSIS — G5601 Carpal tunnel syndrome, right upper limb: Secondary | ICD-10-CM | POA: Diagnosis not present

## 2017-05-12 DIAGNOSIS — M9907 Segmental and somatic dysfunction of upper extremity: Secondary | ICD-10-CM | POA: Diagnosis not present

## 2017-05-12 DIAGNOSIS — G5601 Carpal tunnel syndrome, right upper limb: Secondary | ICD-10-CM | POA: Diagnosis not present

## 2017-05-12 DIAGNOSIS — G5602 Carpal tunnel syndrome, left upper limb: Secondary | ICD-10-CM | POA: Diagnosis not present

## 2017-05-31 DIAGNOSIS — G5602 Carpal tunnel syndrome, left upper limb: Secondary | ICD-10-CM | POA: Diagnosis not present

## 2017-05-31 DIAGNOSIS — G5601 Carpal tunnel syndrome, right upper limb: Secondary | ICD-10-CM | POA: Diagnosis not present

## 2017-05-31 DIAGNOSIS — M9907 Segmental and somatic dysfunction of upper extremity: Secondary | ICD-10-CM | POA: Diagnosis not present

## 2017-06-01 IMAGING — DX DG WRIST COMPLETE 3+V*R*
4 series · 4 of 4 positions shown · non-contrast
Comparison: No recent prior.

CLINICAL DATA: Right wrist pain.  Prior injury.

EXAM:
RIGHT WRIST - COMPLETE 3+ VIEW

[wrist ap]
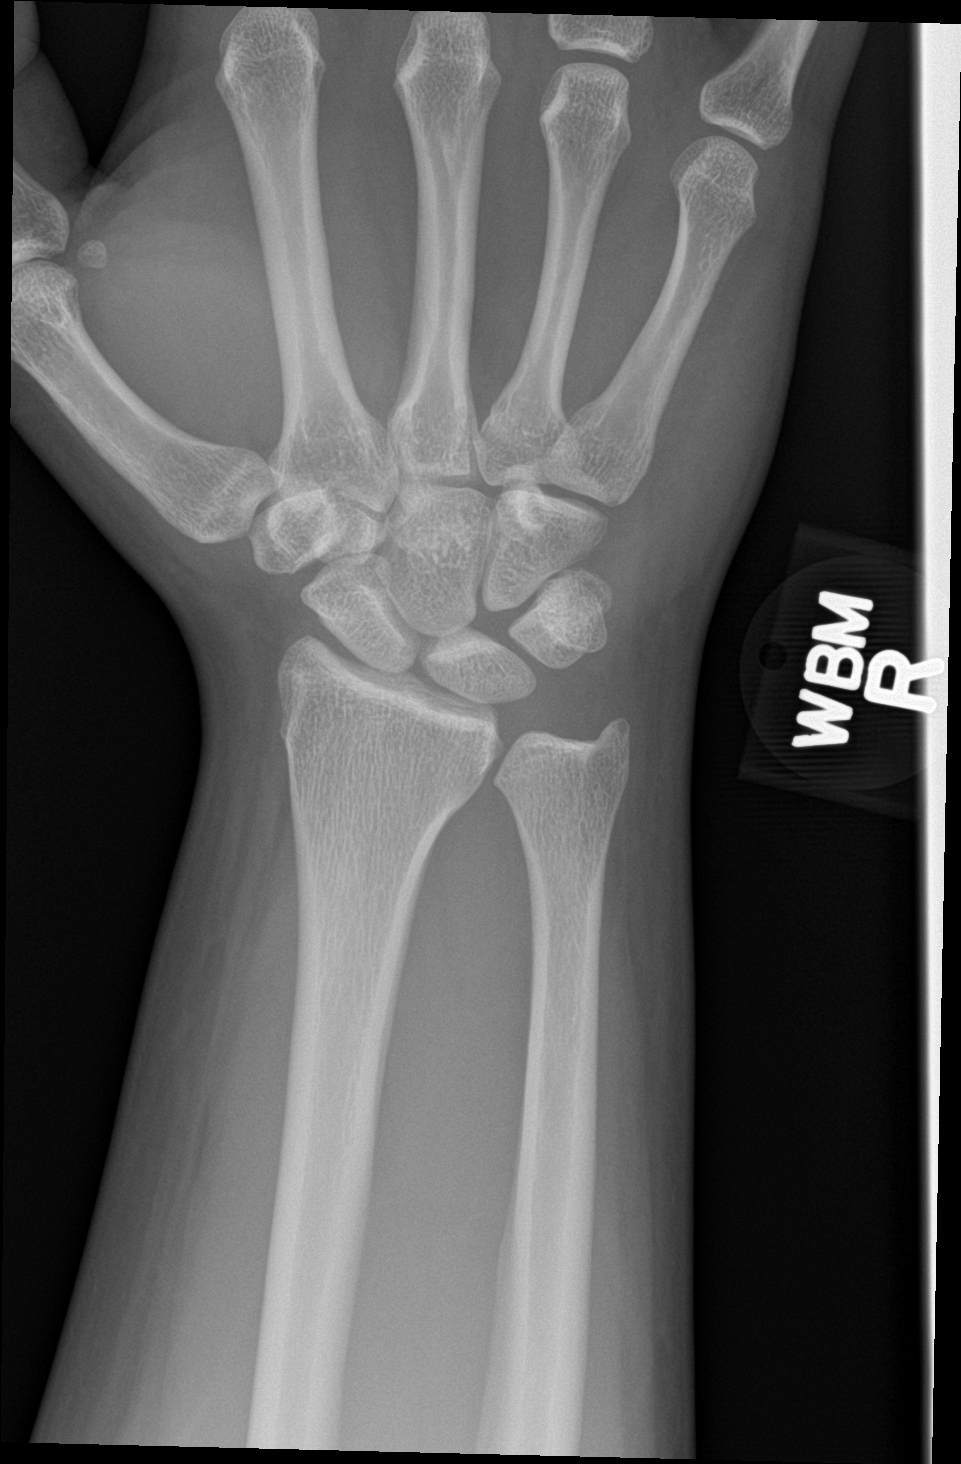

[wrist obl]
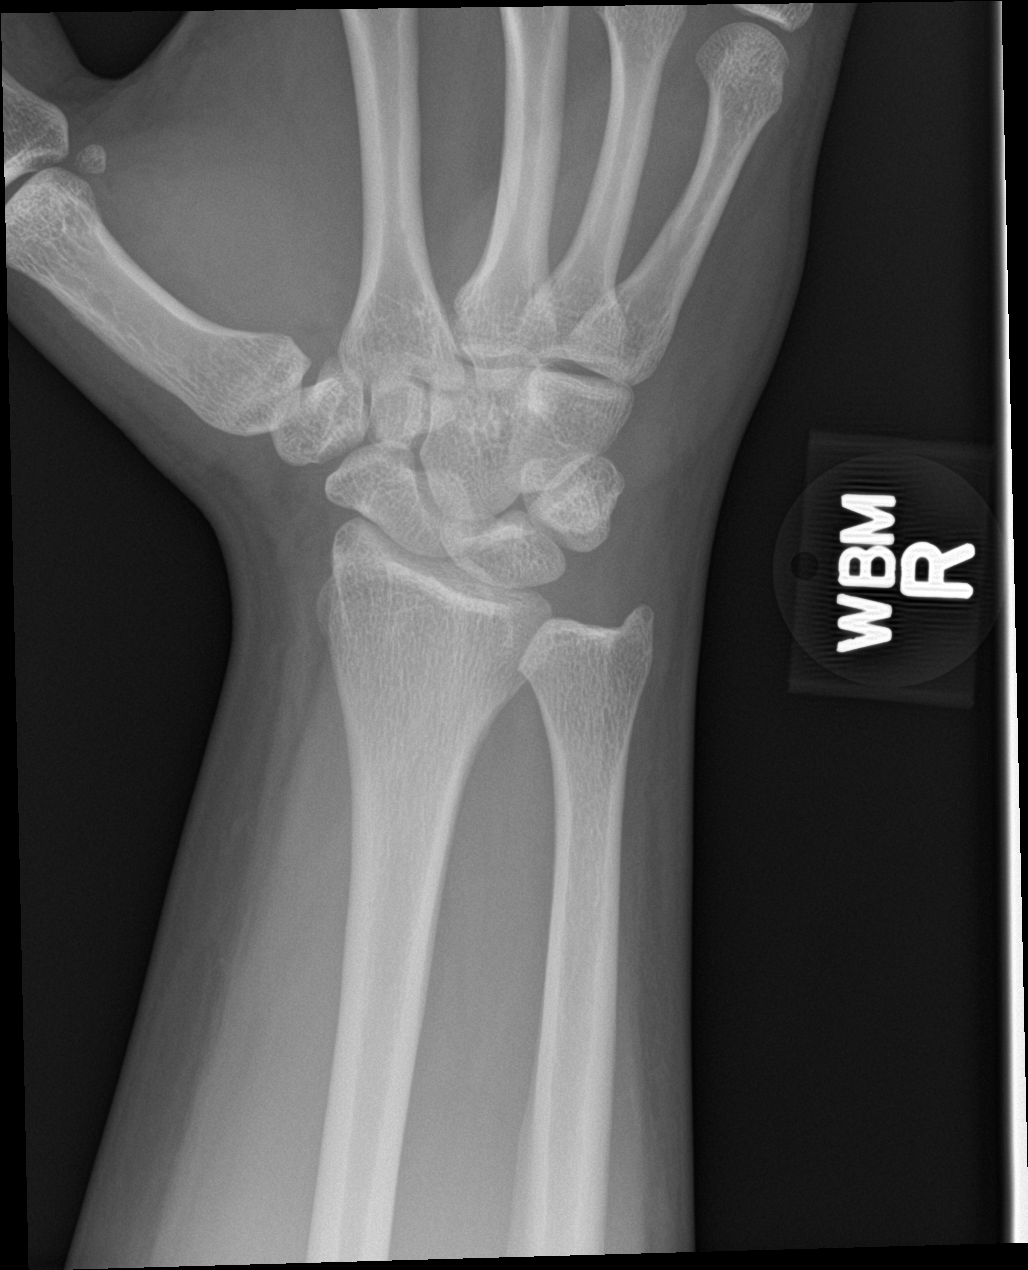

[wrist lat]
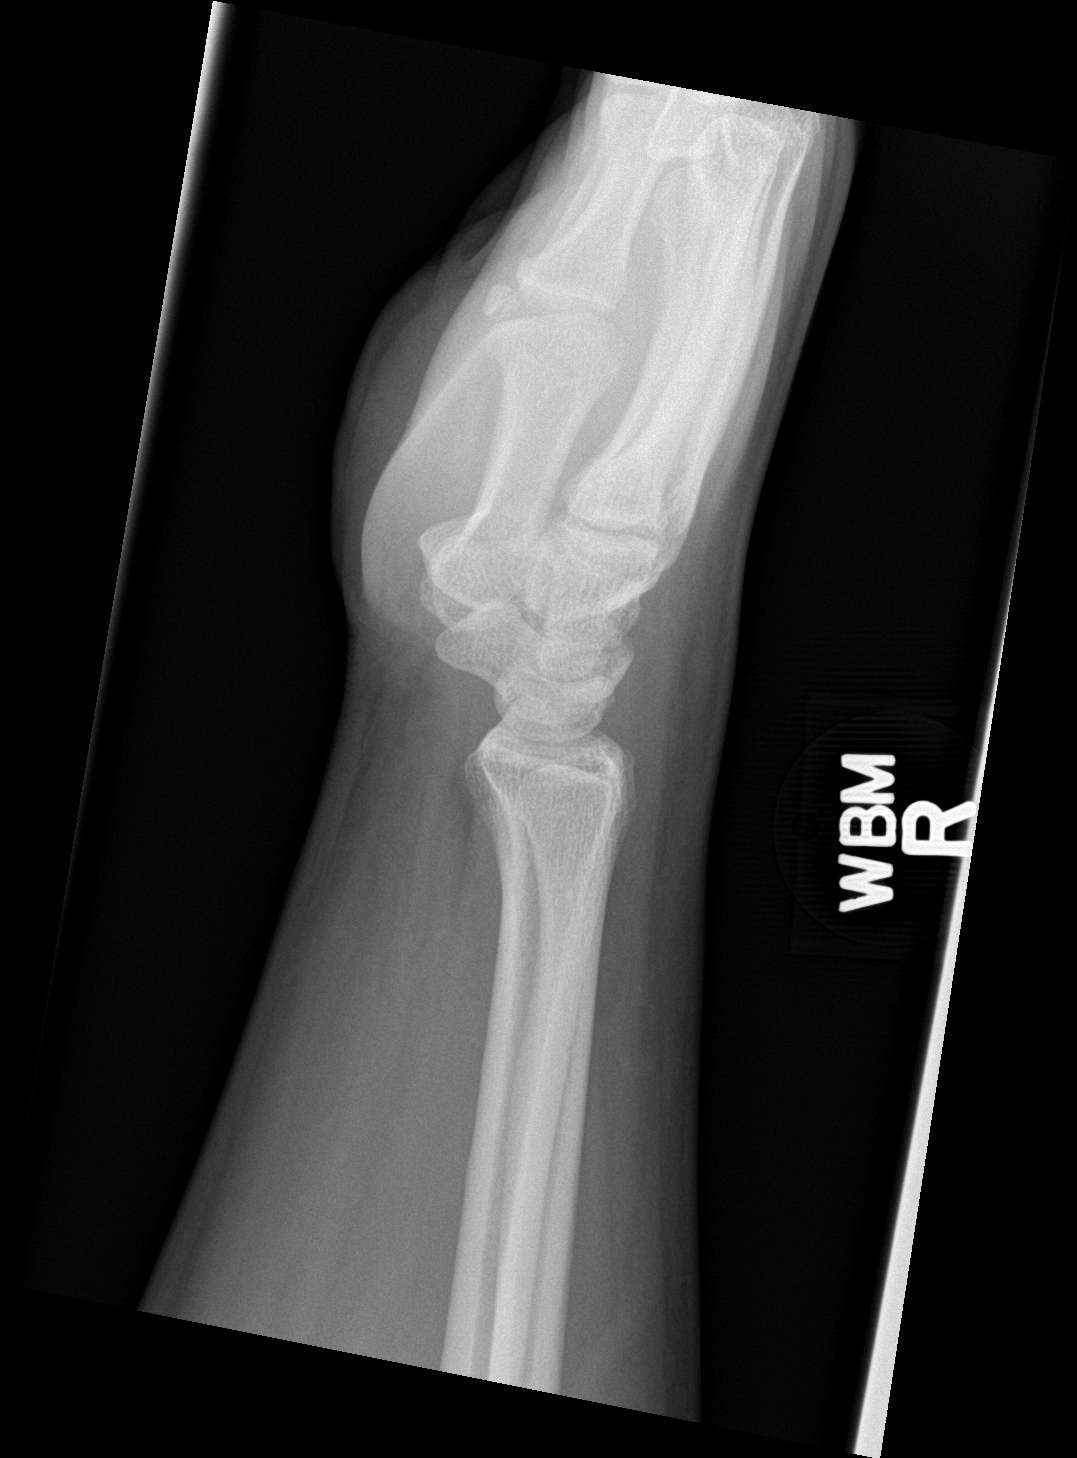

[wrist navicular]
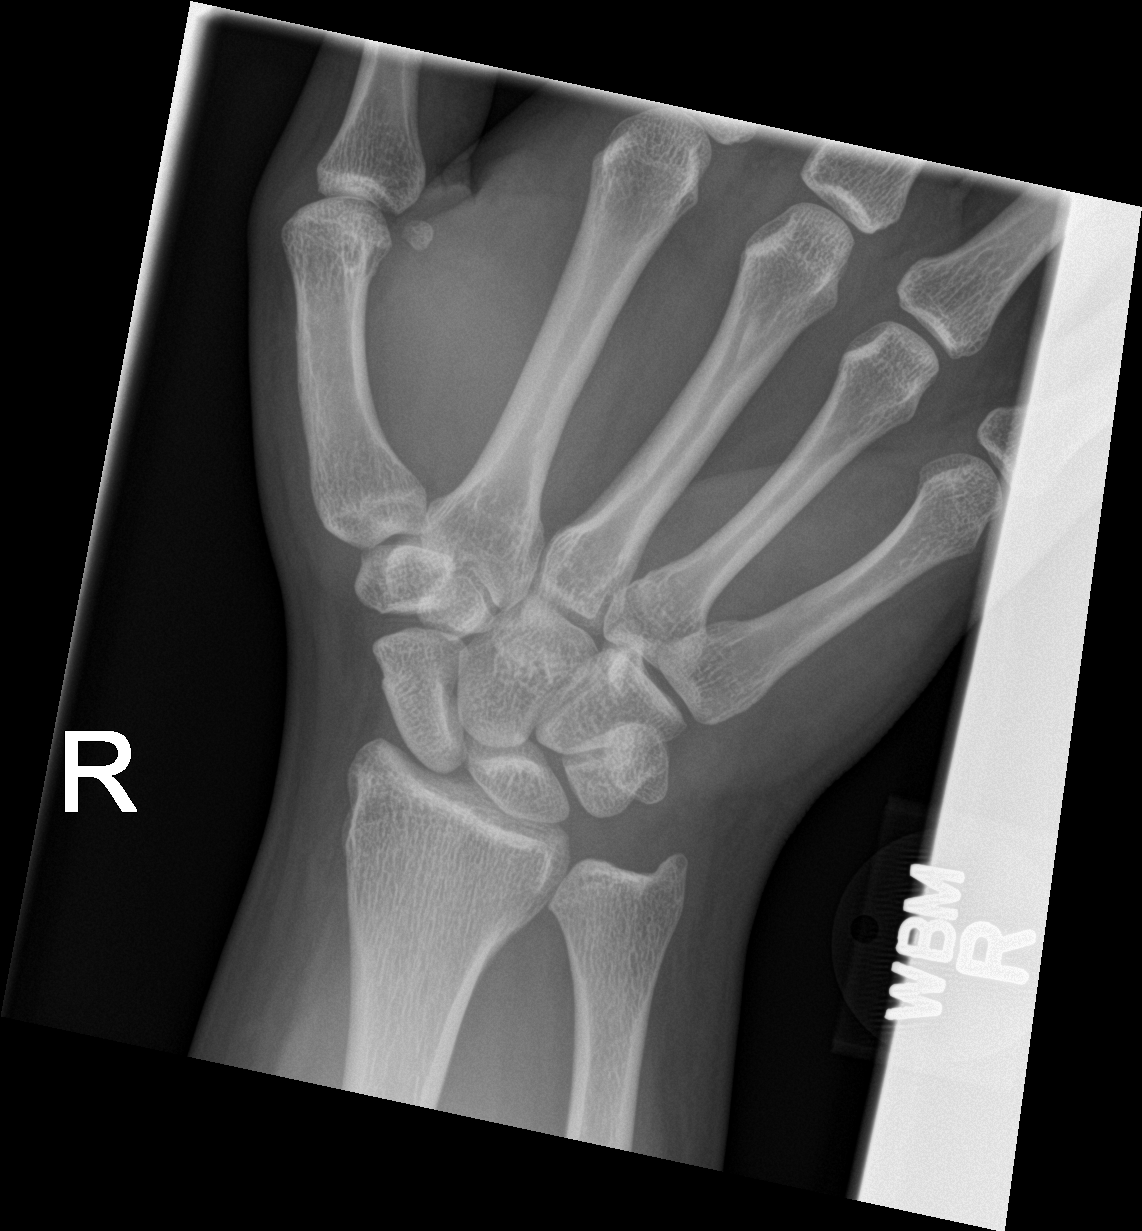

[4 of 4 positions shown; findings below may reference images not displayed]

FINDINGS: There is no evidence of fracture or dislocation. There is no
evidence of arthropathy or other focal bone abnormality. Soft
tissues are unremarkable.
IMPRESSION: No acute or focal abnormality identified.  No evidence of fracture.

## 2017-06-03 DIAGNOSIS — M9907 Segmental and somatic dysfunction of upper extremity: Secondary | ICD-10-CM | POA: Diagnosis not present

## 2017-06-03 DIAGNOSIS — G5601 Carpal tunnel syndrome, right upper limb: Secondary | ICD-10-CM | POA: Diagnosis not present

## 2017-06-03 DIAGNOSIS — G5602 Carpal tunnel syndrome, left upper limb: Secondary | ICD-10-CM | POA: Diagnosis not present

## 2017-06-07 DIAGNOSIS — M9907 Segmental and somatic dysfunction of upper extremity: Secondary | ICD-10-CM | POA: Diagnosis not present

## 2017-06-07 DIAGNOSIS — G5602 Carpal tunnel syndrome, left upper limb: Secondary | ICD-10-CM | POA: Diagnosis not present

## 2017-06-07 DIAGNOSIS — G5601 Carpal tunnel syndrome, right upper limb: Secondary | ICD-10-CM | POA: Diagnosis not present

## 2017-07-07 ENCOUNTER — Other Ambulatory Visit: Payer: Self-pay | Admitting: Nurse Practitioner

## 2017-07-07 DIAGNOSIS — F419 Anxiety disorder, unspecified: Secondary | ICD-10-CM

## 2017-08-23 ENCOUNTER — Encounter: Payer: Self-pay | Admitting: Nurse Practitioner

## 2017-08-23 ENCOUNTER — Ambulatory Visit: Payer: 59 | Admitting: Nurse Practitioner

## 2017-08-23 ENCOUNTER — Other Ambulatory Visit: Payer: Self-pay

## 2017-08-23 VITALS — BP 141/75 | HR 88 | Temp 98.7°F | Ht 68.0 in | Wt 243.6 lb

## 2017-08-23 DIAGNOSIS — R5383 Other fatigue: Secondary | ICD-10-CM | POA: Diagnosis not present

## 2017-08-23 DIAGNOSIS — F5102 Adjustment insomnia: Secondary | ICD-10-CM | POA: Diagnosis not present

## 2017-08-23 DIAGNOSIS — R4584 Anhedonia: Secondary | ICD-10-CM

## 2017-08-23 DIAGNOSIS — R0789 Other chest pain: Secondary | ICD-10-CM

## 2017-08-23 DIAGNOSIS — F419 Anxiety disorder, unspecified: Secondary | ICD-10-CM

## 2017-08-23 DIAGNOSIS — F329 Major depressive disorder, single episode, unspecified: Secondary | ICD-10-CM | POA: Diagnosis not present

## 2017-08-23 DIAGNOSIS — F32A Depression, unspecified: Secondary | ICD-10-CM

## 2017-08-23 MED ORDER — QUETIAPINE FUMARATE 50 MG PO TABS: 50.0000 mg | ORAL_TABLET | Freq: Every day | ORAL | 5 refills | Status: DC

## 2017-08-23 MED ORDER — BUSPIRONE HCL 7.5 MG PO TABS: 3.2500 mg | ORAL_TABLET | Freq: Two times a day (BID) | ORAL | 1 refills | Status: AC

## 2017-08-23 MED ORDER — HYDROXYZINE HCL 25 MG PO TABS: 12.5000 mg | ORAL_TABLET | Freq: Three times a day (TID) | ORAL | 1 refills | Status: DC | PRN

## 2017-08-23 NOTE — Patient Instructions (Addendum)
Scarlette ArJerry Somoza "Greig Castillandrew",   Thank you for coming in to clinic today.  1. START hydroxyzine 25 mg tablet.  Take 1/2 - 1 tab up to three times daily.  2. Reduce buspar to 7.5 mg tab - Take 1/2 tablet twice daily for 7 days then stop.  3. START seroquel 50 mg once daily at bedtime.  Allow yourself at least 6 hours, but preferably 8 hours for sleep each night (or day when moving to 3rd shift).  4. Continue working with therapist and using other stress management techniques.  Please schedule a follow-up appointment with Wilhelmina McardleLauren Kaysee Hergert, AGNP. Return in about 6 weeks (around 10/04/2017) for anxiety and depression.  If you have any other questions or concerns, please feel free to call the clinic or send a message through MyChart. You may also schedule an earlier appointment if necessary.  You will receive a survey after today's visit either digitally by e-mail or paper by [employer] mail. Your experiences and feedback matter to us.  Please respond so we know how we are doing as we provide care for you.   Wilhelmina McardleLauren Merilee Wible, DNP, AGNP-BC Adult Gerontology Nurse Practitioner [facility], [facility]

## 2017-08-23 NOTE — Progress Notes (Signed)
Subjective:    Patient ID: Jose Wheeler, male    DOB: 03/13/1985, 32 y.o.   MRN: [id number]  Jose Wheeler is a 32 y.o. male presenting on 08/23/2017 for Anxiety (pt concern he might need to change his anxiety medication)   HPI Anxiety Patient presents today for followup of anxiety medication.  He is reporting worsening anxiety symptoms including return of chest pains. Chest pain is correlated to worsening anxiety.  Anxiety symptoms include increased irritability, low energy, anhedonia and insomnia.  Insomnia is described as difficulty to wake and fall asleep. Occasionally wakes before alarm (3:30 and falls asleep again sometimes.  5:30 alarm normally.  Bed usually at 10 pm.  Occasionally does not fall asleep until 11:30. He notes he has many stressors - work, family finances, career, home stress.  He has feelings and thoughts at times of "Is it worth it?"   - Drinks 200-250 mg caffeine per day. No appreciable headache on weekends when not drinking these.  Patient asks if these could be worsening anxiety or chest pains. - He has had 2-3 visits with therapist, one today.  EMDR therapy is a tool he is using.  He reports they are currently "working through older memories and problems"  [facility] 08/23/2017 11/09/2016 09/11/2016 08/14/2016 06/11/2016  Decreased Interest 2 0 1 2 0  Down, Depressed, Hopeless 3 0 1 1 0  PHQ - 2 Score 5 0 2 3 0  Altered sleeping 3 1 3 3  -  Tired, decreased energy 3 0 2 3 -  Change in appetite 3 1 3 2  -  Feeling bad or failure about yourself  3 0 1 1 -  Trouble concentrating 3 0 2 1 -  Moving slowly or fidgety/restless 2 0 0 2 -  Suicidal thoughts 1 0 0 0 -  PHQ-9 Score 23 2 13 15  -  Difficult doing work/chores Very difficult Somewhat difficult Very difficult Somewhat difficult -    GAD 7 : Generalized Anxiety Score 08/23/2017 11/09/2016 09/11/2016 08/14/2016  Nervous, Anxious, on Edge 2 0 1 2  Control/stop worrying 3 0 3 3  Worry too much - different things 2 0  3 2  Trouble relaxing 2 0 1 3  Restless 3 0 0 3  Easily annoyed or irritable 3 0 3 3  Afraid - awful might happen 2 0 0 0  Total GAD 7 Score 17 0 11 16  Anxiety Difficulty Very difficult Not difficult at all Very difficult Very difficult    Social History   Tobacco Use  . Smoking status: Never Smoker  . Smokeless tobacco: Never Used  Substance Use Topics  . Alcohol use: No    Alcohol/week: 0.0 oz  . Drug use: No    Review of Systems Per HPI unless specifically indicated above     Objective:    BP (!) 141/75 (BP Location: Right Arm, Patient Position: Sitting, Cuff Size: Normal)   Pulse 88   Temp 98.7 F (37.1 C) (Oral)   Ht 5\' 8"  (1.727 m)   Wt 243 lb 9.6 oz (110.5 kg)   BMI 37.04 kg/m   Wt Readings from Last 3 Encounters:  08/23/17 243 lb 9.6 oz (110.5 kg)  03/29/17 231 lb 6.4 oz (105 kg)  03/22/17 230 lb 9.6 oz (104.6 kg)    Physical Exam  Constitutional: He is oriented to person, place, and time. He appears well-developed and well-nourished. No distress.  HENT:  Head: Normocephalic and atraumatic.  Cardiovascular:  Normal rate, regular rhythm, S1 normal, S2 normal, normal heart sounds and intact distal pulses.  Pulmonary/Chest: Effort normal and breath sounds normal. No respiratory distress.  Neurological: He is alert and oriented to person, place, and time.  Skin: Skin is warm and dry. Capillary refill takes less than 2 seconds.  Psychiatric: His behavior is normal. Judgment and thought content normal. His mood appears anxious. His speech is rapid and/or pressured. Cognition and memory are normal.  Vitals reviewed.   Results for orders placed or performed in visit on 02/08/15  Culture, Group A Strep  Result Value Ref Range   Strep A Culture Negative   POCT rapid strep A  Result Value Ref Range   Rapid Strep A Screen Negative Negative      Assessment & Plan:   Problem List Items Addressed This Visit      Other   Anxiety - Primary   Relevant  Medications   QUEtiapine (SEROQUEL) 50 MG tablet   busPIRone (BUSPAR) 7.5 MG tablet   hydrOXYzine (ATARAX/VISTARIL) 25 MG tablet   Adjustment insomnia   Relevant Medications   QUEtiapine (SEROQUEL) 50 MG tablet   busPIRone (BUSPAR) 7.5 MG tablet    Other Visit Diagnoses    Fatigue, unspecified type       Relevant Orders   CBC with Differential/Platelet   COMPLETE METABOLIC PANEL WITH GFR   Lipid panel   HIV antibody   Hemoglobin A1c   TSH   VITAMIN D 25 Hydroxy (Vit-D Deficiency, Fractures)   B12 and Folate Panel   Magnesium   Comprehensive Metabolic Panel (CMET)   Anhedonia       Other chest pain       Relevant Orders   Lipid panel   TSH   EKG 12-Lead      Anxiety is currently uncontrolled and worsening on today's PHQ 9.  Patient continues to take medications without side effects.  He does not believe BuSpar is doing much for his symptoms.  Therapy sessions have only just begun and are not dealing with active stressors. Chest pain is correlated to worsening anxiety and not likely cardiac. - EKG today.  No changes are noted.  NSR without signs and symptoms of ischemia.  -  START hydroxyzine 25 mg tab 1/2 -1 tab up to tid prn anxiety - Reduce buspirone to 1/2 tab bid x 7 days, then stop - START seroquel 50 mg daily at bedtime. - Continue therapy - Followup 6 weeks.  Meds ordered this encounter  Medications  . QUEtiapine (SEROQUEL) 50 MG tablet    Sig: Take 1 tablet (50 mg total) by mouth at bedtime.    Dispense:  30 tablet    Refill:  5    Order Specific Question:   Supervising Provider    Answer:   Smitty Cords [[id number]]  . busPIRone (BUSPAR) 7.5 MG tablet    Sig: Take 0.5 tablets (3.75 mg total) by mouth 2 (two) times daily for 7 days.    Dispense:  180 tablet    Refill:  1    Order Specific Question:   Supervising Provider    Answer:   Smitty Cords [[id number]]  . hydrOXYzine (ATARAX/VISTARIL) 25 MG tablet    Sig: Take 0.5-1 tablets (12.5-25 mg  total) by mouth 3 (three) times daily as needed for anxiety.    Dispense:  60 tablet    Refill:  1    Order Specific Question:   Supervising Provider    Answer:  Smitty Cords [[id number]]    Follow up plan: Return in about 6 weeks (around 10/04/2017) for anxiety and depression.  [name], DNP, AGPCNP-BC Adult Gerontology Primary Care Nurse Practitioner [facility] 08/23/2017, 5:59 PM

## 2017-09-06 ENCOUNTER — Encounter: Payer: Self-pay | Admitting: Nurse Practitioner

## 2017-09-06 ENCOUNTER — Other Ambulatory Visit: Payer: Self-pay

## 2017-09-06 ENCOUNTER — Ambulatory Visit: Payer: 59 | Admitting: Nurse Practitioner

## 2017-09-06 DIAGNOSIS — F419 Anxiety disorder, unspecified: Secondary | ICD-10-CM | POA: Diagnosis not present

## 2017-09-06 DIAGNOSIS — F32A Depression, unspecified: Secondary | ICD-10-CM

## 2017-09-06 DIAGNOSIS — F329 Major depressive disorder, single episode, unspecified: Secondary | ICD-10-CM

## 2017-09-06 MED ORDER — HYDROXYZINE HCL 25 MG PO TABS: 12.5000 mg | ORAL_TABLET | Freq: Every evening | ORAL | 1 refills | Status: DC | PRN

## 2017-09-06 MED ORDER — PAROXETINE HCL 10 MG PO TABS: 20.0000 mg | ORAL_TABLET | Freq: Every day | ORAL | 1 refills | Status: DC

## 2017-09-06 MED ORDER — ESCITALOPRAM OXALATE 10 MG PO TABS: 20.0000 mg | ORAL_TABLET | Freq: Every day | ORAL | 1 refills | Status: DC

## 2017-09-06 NOTE — Progress Notes (Signed)
Subjective:    Patient ID: Jose Wheeler, male    DOB: 01/20/1986, 32 y.o.   MRN: [id number]  Jose Wheeler is a 32 y.o. male presenting on 09/06/2017 for Anxiety (pt states he cannot tolerated the recent medication changes) and Insomnia   HPI Anxiety Recent medications changes have caused side effects. Had to stop seroquel.    Insomnia  Patient has difficulty sleeping at times, usually oversleeps.  [facility] 09/06/2017 08/23/2017 11/09/2016 09/11/2016  Decreased Interest 3 2 0 1  Down, Depressed, Hopeless 3 3 0 1  PHQ - 2 Score 6 5 0 2  Altered sleeping 3 3 1 3   Tired, decreased energy 3 3 0 2  Change in appetite 3 3 1 3   Feeling bad or failure about yourself  3 3 0 1  Trouble concentrating 2 3 0 2  Moving slowly or fidgety/restless 2 2 0 0  Suicidal thoughts 2 1 0 0  PHQ-9 Score 24 23 2 13   Difficult doing work/chores Extremely dIfficult Very difficult Somewhat difficult Very difficult    GAD 7 : Generalized Anxiety Score 09/06/2017 08/23/2017 11/09/2016  Nervous, Anxious, on Edge 2 2 0  Control/stop worrying 2 3 0  Worry too much - different things 1 2 0  Trouble relaxing 1 2 0  Restless 1 3 0  Easily annoyed or irritable 3 3 0  Afraid - awful might happen 1 2 0  Total GAD 7 Score 11 17 0  Anxiety Difficulty Extremely difficult Very difficult Not difficult at all      Social History   Tobacco Use  . Smoking status: Never Smoker  . Smokeless tobacco: Never Used  Substance Use Topics  . Alcohol use: No    Alcohol/week: 0.0 oz  . Drug use: No    Review of Systems Per HPI unless specifically indicated above     Objective:    BP 134/71 (BP Location: Right Arm, Patient Position: Sitting, Cuff Size: Large)   Pulse 82   Temp 99.1 F (37.3 C) (Oral)   Ht 5\' 8"  (1.727 m)   Wt 244 lb 12.8 oz (111 kg)   BMI 37.22 kg/m   Wt Readings from Last 3 Encounters:  09/06/17 244 lb 12.8 oz (111 kg)  08/23/17 243 lb 9.6 oz (110.5 kg)  03/29/17 231 lb 6.4 oz (105 kg)      Physical Exam Results for orders placed or performed in visit on 02/08/15  Culture, Group A Strep  Result Value Ref Range   Strep A Culture Negative   POCT rapid strep A  Result Value Ref Range   Rapid Strep A Screen Negative Negative      Assessment & Plan:   Problem List Items Addressed This Visit      Other   Anxiety    Other Visit Diagnoses    Anxiety and depression          Worsening significantly.  Work to transition from Coca ColaLexapro, which is having suboptimal control to paroxetine.  Titration in AVS.  May use hydroxyzine prn panic.  Follow-up 6 weeks.  Meds ordered this encounter  Medications  . DISCONTD: hydrOXYzine (ATARAX/VISTARIL) 25 MG tablet    Sig: Take 0.5-1 tablets (12.5-25 mg total) by mouth at bedtime as needed for anxiety.    Dispense:  60 tablet    Refill:  1    Order Specific Question:   Supervising Provider    Answer:   Smitty CordsKARAMALEGOS, ALEXANDER J [[id number]]  .  DISCONTD: PARoxetine (PAXIL) 10 MG tablet    Sig: Take 2 tablets (20 mg total) by mouth daily.    Dispense:  180 tablet    Refill:  1    Order Specific Question:   Supervising Provider    Answer:   Smitty Cords [[id number]]  . DISCONTD: escitalopram (LEXAPRO) 10 MG tablet    Sig: Take 2 tablets (20 mg total) by mouth daily.    Dispense:  180 tablet    Refill:  1    Order Specific Question:   Supervising Provider    Answer:   Smitty Cords [[id number]]    Follow up plan: Return in about 6 weeks (around 10/18/2017) for anxiety, depression.  [name], DNP, AGPCNP-BC Adult Gerontology Primary Care Nurse Practitioner [facility] 09/06/2017, 4:26 PM

## 2017-09-06 NOTE — Patient Instructions (Addendum)
Jose Wheeler "Jose Wheeler",   Thank you for coming in to clinic today.  1. Week 1: - Take Lexapro 20 mg MWFSunday Take 10 mg Tu, Th, Saturday. - Take Paxil 5 mg daily.  Week 2: - Take Lexapro 10 mg daily - Take Paxil 10 mg daily  Week 3: - Take Lexapro 10 mg MWFSunday Take 5 mg Tu, Th, Saturday. - Take Paxil 10 mg daily  Week 4: - Take Lexapro 5 mg daily - Take Paxil 20 mg daily and continue  Week 5:  - Take Lexapro 5 mg MWFSunday  Week 6: STOP Lexapro   2. For your feet: - Take naproxen sodium (Aleeve) 220 mg twice daily for 14 days. - Do plantar fasciitis exercises.   Please schedule a follow-up appointment with [name], AGNP. Return in about 6 weeks (around 10/18/2017) for anxiety, depression.  If you have any other questions or concerns, please feel free to call the clinic or send a message through MyChart. You may also schedule an earlier appointment if necessary.  You will receive a survey after today's visit either digitally by e-mail or paper by [employer]. Your experiences and feedback matter to [place].  Please respond so we know how we are doing as we provide care for you.   [name], DNP, AGNP-BC Adult Gerontology Nurse Practitioner [facility], [facility]    Sleep Hygiene Tips  Take medicines only as directed by your health care provider.  Keep regular sleeping and waking hours. Avoid naps.  Keep a sleep diary to help you and your health care provider figure out what could be causing your insomnia. Include:  When you sleep.  When you wake up during the night.  How well you sleep.  How rested you feel the next day.  Any side effects of medicines you are taking.  What you eat and drink.  Make your bedroom a comfortable place where it is easy to fall asleep:  Put up shades or special blackout curtains to block light from outside.  Use a white noise machine to block noise.  Keep the temperature cool.  Exercise regularly as  directed by your health care provider. Avoid exercising right before bedtime.  Use relaxation techniques to manage stress. Ask your health care provider to suggest some techniques that may work well for you. These may include:  Breathing exercises.  Routines to release muscle tension.  Visualizing peaceful scenes.  Cut back on alcohol, caffeinated beverages, and cigarettes, especially close to bedtime. These can disrupt your sleep.  Do not overeat or eat spicy foods right before bedtime. This can lead to digestive discomfort that can make it hard for you to sleep.  Limit screen use before bedtime. This includes:  Watching TV.  Using your smartphone, tablet, and computer.  Stick to a routine. This can help you fall asleep faster. Try to do a quiet activity, brush your teeth, and go to bed at the same time each night.  Get out of bed if you are still awake after 15 minutes of trying to sleep. Keep the lights down, but try reading or doing a quiet activity. When you feel sleepy, go back to bed.  Make sure that you drive carefully. Avoid driving if you feel very sleepy.  Keep all follow-up appointments as directed by your health care provider. This is important.              Plantar Fascia Stretches / Exercises  See other page with pictures  of each exercise.  Start with 1 or 2 of these exercises that you are most comfortable with. Do not do any exercises that cause you significant worsening pain. Some of these may cause some "stretching soreness" but it should go away after you stop the exercise, and get better over time. Gradually increase up to 3-4 exercises as tolerated.  You may begin exercising the muscles of your foot right away by gently stretching them as follows:  Stretching: Towel stretch: Sit on a hard surface with your injured leg stretched out in front of you. Loop a towel around the ball of your foot and pull the towel toward your body keeping your knee  straight. Hold this position for 15 to 30 seconds then relax. Repeat 3 times. When the towel stretch becomes to easy, you may begin doing the standing calf stretch.  Standing calf stretch: Facing a wall, put your hands against the wall at about eye level. Keep the injured leg back, the uninjured leg forward, and the heel of your injured leg on the floor. Turn your injured foot slightly inward (as if you were pigeon-toed) as you slowly lean into the wall until you feel a stretch in the back of your calf. Hold for 15 to 30 seconds. Repeat 3 times. Do this exercise several times each day. When you can stand comfortably on your injured foot, you can begin stretching the bottom of your foot using the plantar fascia stretch.  Plantar fascia stretch: Stand with the ball of your injured foot on a stair. Reach for the bottom step with your heel until you feel a stretch in the arch of your foot. Hold this position for 15 to 30 seconds and then relax. Repeat 3 times. After you have stretched the bottom muscles of your foot, you can begin strengthening the top muscles of your foot.  Frozen can roll: Roll your bare injured foot back and forth from your heel to your mid-arch over a frozen juice can. Repeat for 3 to 5 minutes. This exercise is particularly helpful if done first thing in the morning.  Towel pickup: With your heel on the ground, pick up a towel with your toes. Release. Repeat 10 to 20 times. When this gets easy, add more resistance by placing a book or small weight on the towel.  Static and dynamic balance exercises: Place a chair next to your non-injured leg and stand upright. (This will provide you with balance if needed.) Stand on your injured foot. Try to raise the arch of your foot while keeping your toes on the floor. Try to maintain this position and balance on your injured side for 30 seconds. This exercise can be made more difficult by doing it on a piece of foam or a pillow, or with your eyes  closed.  Stand in the same position as above. Keep your foot in this position and reach forward in front of you with your injured side's hand, allowing your knee to bend. Repeat this 10 times while maintaining the arch height. This exercise can be made more difficult by reaching farther in front of you. Do 2 sets.  Stand in the same position as above. While maintaining your arch height, reach the injured side's hand across your body toward the chair. The farther you reach, the more challenging the exercise. Do 2 sets of 10.  Next, you can begin strengthening the muscles of your foot and lower leg by using elastic tubing.  Strengthening: Resisted dorsiflexion: Sit with  your injured leg out straight and your foot facing a doorway. Tie a loop in one end of the tubing. Put your foot through the loop so that the tubing goes around the arch of your foot. Tie a knot in the other end of the tubing and shut the knot in the door. Move backward until there is tension in the tubing. Keeping your knee straight, pull your foot toward your body, stretching the tubing. Slowly return to the starting position. Do 3 sets of 10.  Resisted plantar flexion: Sit with your leg outstretched and loop the middle section of the tubing around the ball of your foot. Hold the ends of the tubing in both hands. Gently press the ball of your foot down and point your toes, stretching the tubing. Return to the starting position. Do 3 sets of 10.  Resisted inversion: Sit with your legs out straight and cross your uninjured leg over your injured ankle. Wrap the tubing around the ball of your injured foot and then loop it around your uninjured foot so that the tubing is anchored there at one end. Hold the other end of the tubing in your hand. Turn your injured foot inward and upward. This will stretch the tubing. Return to the starting position. Do 3 sets of 10.  Resisted eversion: Sit with both legs stretched out in front of you, with  your feet about a shoulder's width apart. Tie a loop in one end of the tubing. Put your injured foot through the loop so that the tubing goes around the arch of that foot and wraps around the outside of the uninjured foot. Hold onto the other end of the tubing with your hand to provide tension. Turn your injured foot up and out. Make sure you keep your uninjured foot still so that it will allow the tubing to stretch as you move your injured foot. Return to the starting position. Do 3 sets of 10.

## 2017-09-16 DIAGNOSIS — G5603 Carpal tunnel syndrome, bilateral upper limbs: Secondary | ICD-10-CM | POA: Diagnosis not present

## 2017-09-20 ENCOUNTER — Encounter: Payer: Self-pay | Admitting: Nurse Practitioner

## 2017-09-20 ENCOUNTER — Other Ambulatory Visit: Payer: Self-pay

## 2017-09-20 ENCOUNTER — Ambulatory Visit (INDEPENDENT_AMBULATORY_CARE_PROVIDER_SITE_OTHER): Payer: 59 | Admitting: Nurse Practitioner

## 2017-09-20 VITALS — BP 128/67 | HR 78 | Temp 98.7°F | Ht 68.0 in | Wt 248.0 lb

## 2017-09-20 DIAGNOSIS — Z0000 Encounter for general adult medical examination without abnormal findings: Secondary | ICD-10-CM

## 2017-09-20 NOTE — Patient Instructions (Addendum)
Scarlette ArJerry Macadam "Greig Castillandrew",   Thank you for coming in to clinic today.  1. How To Practice Mindful Eating To practice mindfulness with eating, you'll need a series of exercises that you make part of your daily routine.  There are many simple ways to get started, some of which can have powerful benefits on their own.  It is best to use several of the following in combination for best results: . Eat more slowly and don't rush your meals.  Dorna Bloom. Chew thoroughly.  . Eliminate distractions by turning off the TV and putting down your phone.  . Eat in silence or with other people at a dinner table. . Focus on how the food makes you feel.  . Stop eating when you're full.  . Ask yourself why you're eating. Are you actually hungry? Is it healthy? To begin with, it is a good idea to pick one meal per day, to focus on these points. Once you've got the hang of this, mindfulness will become more natural. Then you can focus on implementing these habits into more meals.  2. START regular physical activity 30 minutes most days of the week.  3. Eat a more balanced diet.  Please schedule a follow-up appointment with Wilhelmina McardleLauren Kyl Givler, AGNP. Return in about 1 year (around 09/21/2018) for annual physical AND 6 weeks for anxiety and depression.  If you have any other questions or concerns, please feel free to call the clinic or send a message through MyChart. You may also schedule an earlier appointment if necessary.  You will receive a survey after today's visit either digitally by e-mail or paper by [employer] mail. Your experiences and feedback matter to us.  Please respond so we know how we are doing as we provide care for you.   Wilhelmina McardleLauren Reyana Leisey, DNP, AGNP-BC  Adult Gerontology Nurse Practitioner [facility], [facility]

## 2017-09-20 NOTE — Progress Notes (Signed)
Subjective:    Patient ID: Jose Wheeler, male    DOB: 1986/01/20, 32 y.o.   MRN: [id number]  Jose Wheeler is a 32 y.o. male presenting on 09/20/2017 for Employment Physical   HPI Annual Physical Exam Patient has been feeling well.  They have no acute concerns today. Sleeping better with med change and doesn't feel "drug out in the morning."  Is now sleeping 6.5-7 hours per night uninterrupted.  Less dependent on energy drinks.  HEALTH MAINTENANCE: Weight/BMI: 37.7 Physical activity: walking for dog 15-20 minutes Diet: More protein, less carbs - Wants to cut out sugars, processed sugars/sodas, cereals; work on evening snacks Seatbelt: always Sunscreen: not usually HIV/HEP C: ordered, HIV neg Optometry: due Dentistry: regularly  VACCINES: Tetanus: 2016 Influenza: regularly  Past Medical History:  Diagnosis Date  . Carpal tunnel syndrome   . Obesity    Past Surgical History:  Procedure Laterality Date  . NO PAST SURGERIES     Social History   Socioeconomic History  . Marital status: Married    Spouse name: Not on file  . Number of children: Not on file  . Years of education: Not on file  . Highest education level: Not on file  Occupational History  . Occupation: Building control surveyor: [employer]  Social Needs  . Financial resource strain: Not hard at all  . Food insecurity:    Worry: Never true    Inability: Never true  . Transportation needs:    Medical: No    Non-medical: No  Tobacco Use  . Smoking status: Never Smoker  . Smokeless tobacco: Never Used  Substance and Sexual Activity  . Alcohol use: No    Alcohol/week: 0.0 oz  . Drug use: No  . Sexual activity: Yes  Lifestyle  . Physical activity:    Days per week: 7 days    Minutes per session: 20 min  . Stress: Only a little  Relationships  . Social connections:    Talks on phone: Patient refused    Gets together: Patient refused    Attends religious service: Patient refused    Active member of club or  organization: Patient refused    Attends meetings of clubs or organizations: Patient refused    Relationship status: Patient refused  . Intimate partner violence:    Fear of current or ex partner: No    Emotionally abused: No    Physically abused: No    Forced sexual activity: No  Other Topics Concern  . Not on file  Social History Narrative  . Not on file   No family history on file. Current Outpatient Medications on File Prior to Visit  Medication Sig  . escitalopram (LEXAPRO) 10 MG tablet Take 2 tablets (20 mg total) by mouth daily.  Marland Kitchen PARoxetine (PAXIL) 10 MG tablet Take 2 tablets (20 mg total) by mouth daily.  . hydrOXYzine (ATARAX/VISTARIL) 25 MG tablet Take 0.5-1 tablets (12.5-25 mg total) by mouth at bedtime as needed for anxiety. (Patient not taking: Reported on 09/20/2017)   No current facility-administered medications on file prior to visit.     Review of Systems  Constitutional: Positive for unexpected weight change (weight gain steadily). Negative for activity change, appetite change and fatigue.  HENT: Negative for congestion, hearing loss and trouble swallowing.   Eyes: Negative for visual disturbance (blurry vision (reading small print)).  Respiratory: Negative for choking, shortness of breath and wheezing.   Cardiovascular: Negative for chest pain and palpitations.  Gastrointestinal:  Negative for abdominal pain, blood in stool, constipation and diarrhea.  Genitourinary: Negative for difficulty urinating, discharge, flank pain, genital sores, penile pain, penile swelling, scrotal swelling and testicular pain.  Musculoskeletal: Positive for arthralgias (bilateral carpal tunnel in treatment w orthopedics). Negative for back pain and myalgias.  Skin: Negative for color change, rash and wound.  Allergic/Immunologic: Negative for environmental allergies.  Neurological: Negative for dizziness, seizures, weakness and headaches.  Psychiatric/Behavioral: Positive for  dysphoric mood. Negative for behavioral problems, decreased concentration, sleep disturbance and suicidal ideas. The patient is nervous/anxious.    Per HPI unless specifically indicated above     Objective:    BP 128/67 (BP Location: Right Arm, Patient Position: Sitting, Cuff Size: Large)   Pulse 78   Temp 98.7 F (37.1 C) (Oral)   Ht 5\' 8"  (1.727 m)   Wt 248 lb (112.5 kg)   BMI 37.71 kg/m   Waist circumference: 47" Wt Readings from Last 3 Encounters:  09/20/17 248 lb (112.5 kg)  09/06/17 244 lb 12.8 oz (111 kg)  08/23/17 243 lb 9.6 oz (110.5 kg)    Physical Exam  Constitutional: He is oriented to person, place, and time. He appears well-developed and well-nourished. No distress.  HENT:  Head: Normocephalic and atraumatic.  Right Ear: External ear normal.  Left Ear: External ear normal.  Nose: Nose normal.  Mouth/Throat: Oropharynx is clear and moist.  Eyes: Pupils are equal, round, and reactive to light. Conjunctivae are normal.  Neck: Normal range of motion. Neck supple. No JVD present. No tracheal deviation present. No thyromegaly present.  Cardiovascular: Normal rate, regular rhythm, normal heart sounds and intact distal pulses. Exam reveals no gallop and no friction rub.  No murmur heard. Pulmonary/Chest: Effort normal and breath sounds normal. No respiratory distress.  Abdominal: Soft. Bowel sounds are normal. He exhibits no distension. There is no hepatosplenomegaly. There is no tenderness.  Musculoskeletal: Normal range of motion.  Lymphadenopathy:    He has no cervical adenopathy.  Neurological: He is alert and oriented to person, place, and time. No cranial nerve deficit.  Skin: Skin is warm and dry.  Psychiatric: He has a normal mood and affect. His behavior is normal. Judgment and thought content normal.  Nursing note and vitals reviewed.   Results for orders placed or performed in visit on 02/08/15  Culture, Group A Strep  Result Value Ref Range   Strep A  Culture Negative   POCT rapid strep A  Result Value Ref Range   Rapid Strep A Screen Negative Negative      Assessment & Plan:   Problem List Items Addressed This Visit    None    Visit Diagnoses    Encounter for annual physical exam    -  Primary    Physical exam with no new findings.  Well adult with no acute concerns.  Is doing well with medication transition for anxiety and depression.  Mildly improved sleep habits.  Obesity worsening.  Patient with dietary indiscretions and continued emotional eating.  Plan: 1. Obtain health maintenance screenings as above according to age. - Increase physical activity to 30 minutes most days of the week.  - Eat healthy diet high in vegetables and fruits; low in refined carbohydrates. 2. Return 1 year for annual physical AND in 6 weeks for anxiety and depression.     Follow up plan: Return in about 1 year (around 09/21/2018) for annual physical AND 6 weeks for anxiety and depression.  Wilhelmina McardleLauren Rod Majerus, DNP, AGPCNP-BC  Adult Gerontology Primary Care Nurse Practitioner [facility] 09/20/2017, 5:44 PM

## 2017-10-11 DIAGNOSIS — R5383 Other fatigue: Secondary | ICD-10-CM | POA: Diagnosis not present

## 2017-10-11 DIAGNOSIS — R0789 Other chest pain: Secondary | ICD-10-CM | POA: Diagnosis not present

## 2017-10-13 LAB — CBC WITH DIFFERENTIAL/PLATELET
Basophils Absolute: 0 10*3/uL (ref 0.0–0.2)
Basos: 0 %
EOS (ABSOLUTE): 0.2 10*3/uL (ref 0.0–0.4)
Eos: 3 %
Hematocrit: 45.1 % (ref 37.5–51.0)
Hemoglobin: 15.1 g/dL (ref 13.0–17.7)
Immature Grans (Abs): 0 10*3/uL (ref 0.0–0.1)
Immature Granulocytes: 0 %
Lymphocytes Absolute: 3.2 10*3/uL — ABNORMAL HIGH (ref 0.7–3.1)
Lymphs: 47 %
MCH: 28.7 pg (ref 26.6–33.0)
MCHC: 33.5 g/dL (ref 31.5–35.7)
MCV: 86 fL (ref 79–97)
Monocytes Absolute: 0.6 10*3/uL (ref 0.1–0.9)
Monocytes: 9 %
Neutrophils Absolute: 2.8 10*3/uL (ref 1.4–7.0)
Neutrophils: 41 %
Platelets: 194 10*3/uL (ref 150–450)
RBC: 5.26 x10E6/uL (ref 4.14–5.80)
RDW: 14.6 % (ref 12.3–15.4)
WBC: 6.8 10*3/uL (ref 3.4–10.8)

## 2017-10-13 LAB — COMPREHENSIVE METABOLIC PANEL
ALT: 72 IU/L — ABNORMAL HIGH (ref 0–44)
AST: 38 IU/L (ref 0–40)
Albumin/Globulin Ratio: 2.2 (ref 1.2–2.2)
Albumin: 4.1 g/dL (ref 3.5–5.5)
Alkaline Phosphatase: 83 IU/L (ref 39–117)
BUN/Creatinine Ratio: 10 (ref 9–20)
BUN: 13 mg/dL (ref 6–20)
Bilirubin Total: 0.4 mg/dL (ref 0.0–1.2)
CO2: 22 mmol/L (ref 20–29)
Calcium: 9 mg/dL (ref 8.7–10.2)
Chloride: 105 mmol/L (ref 96–106)
Creatinine, Ser: 1.28 mg/dL — ABNORMAL HIGH (ref 0.76–1.27)
GFR calc Af Amer: 85 mL/min/{1.73_m2} (ref >59)
GFR calc non Af Amer: 74 mL/min/{1.73_m2} (ref >59)
Globulin, Total: 1.9 g/dL (ref 1.5–4.5)
Glucose: 90 mg/dL (ref 65–99)
Potassium: 3.9 mmol/L (ref 3.5–5.2)
Sodium: 143 mmol/L (ref 134–144)
Total Protein: 6 g/dL (ref 6.0–8.5)

## 2017-10-13 LAB — LIPID PANEL
Chol/HDL Ratio: 5.2 ratio — ABNORMAL HIGH (ref 0.0–5.0)
Cholesterol, Total: 172 mg/dL (ref 100–199)
HDL: 33 mg/dL — ABNORMAL LOW (ref >39)
LDL Calculated: 116 mg/dL — ABNORMAL HIGH (ref 0–99)
Triglycerides: 114 mg/dL (ref 0–149)
VLDL Cholesterol Cal: 23 mg/dL (ref 5–40)

## 2017-10-13 LAB — B12 AND FOLATE PANEL
Folate: 16.4 ng/mL (ref >3.0)
Vitamin B-12: 906 pg/mL (ref 232–1245)

## 2017-10-13 LAB — HIV ANTIBODY (ROUTINE TESTING W REFLEX): HIV Screen 4th Generation wRfx: NONREACTIVE

## 2017-10-13 LAB — VITAMIN D 25 HYDROXY (VIT D DEFICIENCY, FRACTURES): Vit D, 25-Hydroxy: 22.9 ng/mL — ABNORMAL LOW (ref 30.0–100.0)

## 2017-10-13 LAB — HEMOGLOBIN A1C
Est. average glucose Bld gHb Est-mCnc: 108 mg/dL
Hgb A1c MFr Bld: 5.4 % (ref 4.8–5.6)

## 2017-10-13 LAB — MAGNESIUM: Magnesium: 2.1 mg/dL (ref 1.6–2.3)

## 2017-10-13 LAB — TSH: TSH: 4.61 u[IU]/mL — ABNORMAL HIGH (ref 0.450–4.500)

## 2017-10-14 ENCOUNTER — Other Ambulatory Visit: Payer: Self-pay | Admitting: Nurse Practitioner

## 2017-10-14 DIAGNOSIS — R7989 Other specified abnormal findings of blood chemistry: Secondary | ICD-10-CM

## 2017-10-14 DIAGNOSIS — Z0289 Encounter for other administrative examinations: Secondary | ICD-10-CM

## 2017-10-14 DIAGNOSIS — Z0184 Encounter for antibody response examination: Secondary | ICD-10-CM

## 2017-10-14 NOTE — Addendum Note (Signed)
Addended by: Vernard GamblesKENNEDY, Tatyanna Cronk R on: 10/14/2017 07:54 AM   Modules accepted: Orders

## 2017-10-15 LAB — MEASLES/MUMPS/RUBELLA IMMUNITY
MUMPS ABS, IGG: 88.8 AU/mL (ref >10.9)
RUBEOLA AB, IGG: 66.5 AU/mL (ref >29.9)
Rubella Antibodies, IGG: 3.04 index (ref >0.99)

## 2017-10-29 ENCOUNTER — Ambulatory Visit: Payer: 59 | Admitting: Nurse Practitioner

## 2017-11-22 ENCOUNTER — Encounter: Payer: Self-pay | Admitting: Nurse Practitioner

## 2017-12-07 ENCOUNTER — Encounter: Payer: Self-pay | Admitting: Nurse Practitioner

## 2017-12-07 ENCOUNTER — Other Ambulatory Visit: Payer: Self-pay

## 2017-12-07 ENCOUNTER — Ambulatory Visit: Payer: 59 | Admitting: Nurse Practitioner

## 2017-12-07 VITALS — BP 134/82 | HR 77 | Temp 98.7°F | Ht 68.0 in | Wt 243.2 lb

## 2017-12-07 DIAGNOSIS — F5104 Psychophysiologic insomnia: Secondary | ICD-10-CM | POA: Diagnosis not present

## 2017-12-07 DIAGNOSIS — Z23 Encounter for immunization: Secondary | ICD-10-CM

## 2017-12-07 DIAGNOSIS — F419 Anxiety disorder, unspecified: Secondary | ICD-10-CM

## 2017-12-07 DIAGNOSIS — F32A Depression, unspecified: Secondary | ICD-10-CM

## 2017-12-07 DIAGNOSIS — F329 Major depressive disorder, single episode, unspecified: Secondary | ICD-10-CM

## 2017-12-07 MED ORDER — PAROXETINE HCL 40 MG PO TABS: 40.0000 mg | ORAL_TABLET | Freq: Every day | ORAL | 5 refills | Status: DC

## 2017-12-07 NOTE — Patient Instructions (Addendum)
Jose Ar "[name]",   Thank you for coming in to clinic today.  1. Let me know if you would like to see [facility] if you qualify for financial assistance  2. Paxil - take 30 mg daily for 1 week, then increase to 40 mg once daily.  3. Referral to [facility] psychiatry - they will call you to schedule your appointment.  4. PSYCHIATRY / THERAPY-COUSENLING Internal Referral: [facility] - [facility] [employer] [facility]) Address: [address], [place], [place] [address] Hours: 8:30AM-5PM Phone: [phone]  Self Referral [facility] (All ages) - NOT [facility] [address], [place], [id number] Phone: [phone] (Option 1) [email or web address]  [facility]) [place] [address], [place], [place] [address] Phone: [phone]  [employer], available walk-in 9am-4pm M-F [address] [place], [place] [address] Hours: 9am - 4pm (M-F, walk in available) [phone]   --------------------------------------------------------------- [facility] COUNSELING ONLY Internal Referral: Delight Ovens, LCSW [facility]  Self Referral: 1. [facility] [place] [facility]   Address: [address] [place], [place], [place] [address] Hours: Open today  9AM-7PM Phone: [phone]  2. Anell Barr [employer], [facility]  - [facility] Address: [address] [place], [place] [address] Phone: [phone]  Please schedule a follow-up appointment with [name], AGNP. Return in about 4 weeks (around 01/04/2018) for anxiety.  If you have any other questions or concerns, please feel free to call the clinic or send a message through MyChart. You may also schedule an earlier appointment if necessary.  You will receive a survey after today's visit either digitally by e-mail or paper by [employer]. Your experiences and feedback matter to [place].  Please respond so we know  how we are doing as we provide care for you.   [name], DNP, AGNP-BC Adult Gerontology Nurse Practitioner [facility]  Influenza (Flu) Vaccine (Inactivated or Recombinant): What You Need to Know 1. Why get vaccinated? Influenza ("flu") is a contagious disease that spreads around the [place] every year, usually between October and May. Flu is caused by influenza viruses, and is spread mainly by coughing, sneezing, and close contact. Anyone can get flu. Flu strikes suddenly and can last several days. Symptoms vary by age, but can include:  fever/chills  sore throat  muscle aches  fatigue  cough  headache  runny or stuffy nose  Flu can also lead to pneumonia and blood infections, and cause diarrhea and seizures in children. If you have a medical condition, such as heart or lung disease, flu can make it worse. Flu is more dangerous for some people. Infants and young children, people 9 years of age and older, pregnant women, and people with certain health conditions or a weakened immune system are at greatest risk. Each year thousands of people in the [place] die from flu, and many more are hospitalized. Flu vaccine can:  keep you from getting flu,  make flu less severe if you do get it, and  keep you from spreading flu to your family and other people. 2. Inactivated and recombinant flu vaccines A dose of flu vaccine is recommended every flu season. Children 6 months through 105 years of age may need two doses during the same flu season. Everyone else needs only one dose each flu season. Some inactivated flu vaccines contain a very small amount of a mercury-based preservative called thimerosal. Studies have not  shown thimerosal in vaccines to be harmful, but flu vaccines that do not contain thimerosal are available. There is no live flu virus in flu shots. They cannot cause the flu. There are many flu viruses, and they are always changing.  Each year a new flu vaccine is made to protect against three or four viruses that are likely to cause disease in the upcoming flu season. But even when the vaccine doesn't exactly match these viruses, it may still provide some protection. Flu vaccine cannot prevent:  flu that is caused by a virus not covered by the vaccine, or  illnesses that look like flu but are not.  It takes about 2 weeks for protection to develop after vaccination, and protection lasts through the flu season. 3. Some people should not get this vaccine Tell the person who is giving you the vaccine:  If you have any severe, life-threatening allergies. If you ever had a life-threatening allergic reaction after a dose of flu vaccine, or have a severe allergy to any part of this vaccine, you may be advised not to get vaccinated. Most, but not all, types of flu vaccine contain a small amount of egg protein.  If you ever had Guillain-Barr Syndrome (also called GBS). Some people with a history of GBS should not get this vaccine. This should be discussed with your doctor.  If you are not feeling well. It is usually okay to get flu vaccine when you have a mild illness, but you might be asked to come back when you feel better.  4. Risks of a vaccine reaction With any medicine, including vaccines, there is a chance of reactions. These are usually mild and go away on their own, but serious reactions are also possible. Most people who get a flu shot do not have any problems with it. Minor problems following a flu shot include:  soreness, redness, or swelling where the shot was given  hoarseness  sore, red or itchy eyes  cough  fever  aches  headache  itching  fatigue  If these problems occur, they usually begin soon after the shot and last 1 or 2 days. More serious problems following a flu shot can include the following:  There may be a small increased risk of Guillain-Barre Syndrome (GBS) after inactivated flu  vaccine. This risk has been estimated at 1 or 2 additional cases per million people vaccinated. This is much lower than the risk of severe complications from flu, which can be prevented by flu vaccine.  Young children who get the flu shot along with pneumococcal vaccine (PCV13) and/or DTaP vaccine at the same time might be slightly more likely to have a seizure caused by fever. Ask your doctor for more information. Tell your doctor if a child who is getting flu vaccine has ever had a seizure.  Problems that could happen after any injected vaccine:  People sometimes faint after a medical procedure, including vaccination. Sitting or lying down for about 15 minutes can help prevent fainting, and injuries caused by a fall. Tell your doctor if you feel dizzy, or have vision changes or ringing in the ears.  Some people get severe pain in the shoulder and have difficulty moving the arm where a shot was given. This happens very rarely.  Any medication can cause a severe allergic reaction. Such reactions from a vaccine are very rare, estimated at about 1 in a million doses, and would happen within a few minutes to a few hours after the vaccination.  As with any medicine, there is a very remote chance of a vaccine causing a serious injury or death. The safety of vaccines is always being monitored. For more information, visit: http://floyd.org/ 5. What if there is a serious reaction? What should I look for? Look for anything that concerns you, such as signs of a severe allergic reaction, very high fever, or unusual behavior. Signs of a severe allergic reaction can include hives, swelling of the face and throat, difficulty breathing, a fast heartbeat, dizziness, and weakness. These would start a few minutes to a few hours after the vaccination. What should I do?  If you think it is a severe allergic reaction or other emergency that can't wait, call 9-1-1 and get the person to the nearest hospital.  Otherwise, call your doctor.  Reactions should be reported to the Vaccine Adverse Event Reporting System (VAERS). Your doctor should file this report, or you can do it yourself through the VAERS web site at www.vaers.LAgents.no, or by calling 1-972 295 5629. ? VAERS does not give medical advice. 6. The National Vaccine Injury Compensation Program The [employer] Vaccine Injury Compensation Program (VICP) is a federal program that was created to compensate people who may have been injured by certain vaccines. Persons who believe they may have been injured by a vaccine can learn about the program and about filing a claim by calling 1-(954) 625-0271 or visiting the VICP website at SpiritualWord.at. There is a time limit to file a claim for compensation. 7. How can I learn more?  Ask your healthcare provider. He or she can give you the vaccine package insert or suggest other sources of information.  Call your local or state health department.  Contact the Centers for Disease Control and Prevention (CDC): ? Call 567-082-7613 (1-800-CDC-INFO) or ? Visit CDC's website at BiotechRoom.com.cy Vaccine Information Statement, Inactivated Influenza Vaccine (09/29/2013) This information is not intended to replace advice given to you by your health care provider. Make sure you discuss any questions you have with your health care provider. Document Released: 12/04/2005 Document Revised: 10/31/2015 Document Reviewed: 10/31/2015 Elsevier Interactive Patient Education  2017 [employer].

## 2017-12-07 NOTE — Progress Notes (Signed)
Subjective:    Patient ID: Jose Wheeler, male    DOB: [birth year], 32 y.o.   MRN: [id number]  Jose Wheeler is a 32 y.o. male presenting on 12/07/2017 for Medication Refill and Sleeping Problem (pt reports sleeping to much 8- 10hrs on the weekend, but still feeling exhausted. On the weekends 10-12 hrs.)   HPI Anxiety/ Depression/ Insomnia Patient continues working 3rd shift Sun-Thurs - Sleeps 8:30-9:30 and until 5:30-6:30 during days. Is also donating plasma.  Sleeps well with only one interruption, but never feels fully rested.  Is also sleeping too much. - Often feels panicked feeling "like walking around with lead blanket"  That is weight and is slogging through a week.  Doing bare minimum to contribute to society.  Then feels "strangled" by this blanket and collapses in escape on the weekends. - Financial stress is also contributing. - Is having SI and becomes focused on SI when it "slips into thought." or worries why it was there in the first place when these occur. - Has stopped seeing counselor for financial reasons.  Depression screen [facility] 2/9 12/07/2017 09/06/2017 08/23/2017 11/09/2016 09/11/2016  Decreased Interest 3 3 2  0 1  Down, Depressed, Hopeless 2 3 3  0 1  PHQ - 2 Score 5 6 5  0 2  Altered sleeping 3 3 3 1 3   Tired, decreased energy 3 3 3  0 2  Change in appetite 3 3 3 1 3   Feeling bad or failure about yourself  2 3 3  0 1  Trouble concentrating 2 2 3  0 2  Moving slowly or fidgety/restless 2 2 2  0 0  Suicidal thoughts 1 2 1  0 0  PHQ-9 Score 21 24 23 2 13   Difficult doing work/chores Very difficult Extremely dIfficult Very difficult Somewhat difficult Very difficult     Social History   Tobacco Use  . Smoking status: Never Smoker  . Smokeless tobacco: Never Used  Substance Use Topics  . Alcohol use: No    Alcohol/week: 0.0 standard drinks  . Drug use: No    Review of Systems Per HPI unless specifically indicated above     Objective:    BP 134/82   Pulse 77   Temp  98.7 F (37.1 C) (Oral)   Ht 5\' 8"  (1.727 m)   Wt 243 lb 3.2 oz (110.3 kg)   BMI 36.98 kg/m   Wt Readings from Last 3 Encounters:  12/07/17 243 lb 3.2 oz (110.3 kg)  09/20/17 248 lb (112.5 kg)  09/06/17 244 lb 12.8 oz (111 kg)    Physical Exam  Constitutional: He is oriented to person, place, and time. He appears well-developed and well-nourished. No distress.  HENT:  Head: Normocephalic and atraumatic.  Cardiovascular: Normal rate, regular rhythm, S1 normal, S2 normal, normal heart sounds and intact distal pulses.  Pulmonary/Chest: Effort normal and breath sounds normal. No respiratory distress.  Neurological: He is alert and oriented to person, place, and time.  Skin: Skin is warm and dry. Capillary refill takes less than 2 seconds.  Psychiatric: His speech is normal. Judgment normal. He is withdrawn. Cognition and memory are normal. He exhibits a depressed mood. He expresses suicidal (intermittently, not active) ideation. He expresses no homicidal ideation. He expresses no suicidal plans and no homicidal plans.  Vitals reviewed.  Results for orders placed or performed in visit on 10/14/17  Measles/Mumps/Rubella Immunity  Result Value Ref Range   Rubella Antibodies, IGG 3.04 Immune >0.99 index   RUBEOLA AB, IGG 66.5 Immune >29.9  AU/mL   MUMPS ABS, IGG 88.8 Immune >10.9 AU/mL      Assessment & Plan:   Problem List Items Addressed This Visit      Other   Psychophysiologic insomnia   Anxiety and depression - Primary Worsening anxiety, depression.  Patient has not had any significant change since being on paroxetine.  Mild worsening off lexparo and onto paxil.  Could consider another agent like Wellbutrin, but should use paroxetine to full dose prior to change. Patient verbalizes understanding.  Also states he does not want to be on a medication he could use to harm himself.  Plan: 1. INCREASE paroxetine to 40 mg once daily 2. Referral [facility] 3. Continue to encourage patient to  seek counseling/therapy. 4. Encouraged good sleep hygiene.  Can consider trazodone in future.  Seroquel was not tolerated. 5. Cannot fully exclude bipolar depression as dx with initial SSRI failure and second SSRI lack of success to date.  Patient may be interested in pharmacogenetic testing if covered by [employer] insurance. 6. Follow-up 4 weeks or with psychiatry whichever comes first.   Relevant Medications   PARoxetine (PAXIL) 40 MG tablet   Other Relevant Orders   Ambulatory referral to Psychiatry    Other Visit Diagnoses    Needs flu shot     Pt < age 78.  Needs annual influenza vaccine.  Plan: 1. Administer Quad flu vaccine.    Relevant Orders   Flu Vaccine QUAD 6+ mos PF IM (Fluarix Quad PF) (Completed)      Meds ordered this encounter  Medications  . PARoxetine (PAXIL) 40 MG tablet    Sig: Take 1 tablet (40 mg total) by mouth daily.    Dispense:  30 tablet    Refill:  5    Order Specific Question:   Supervising Provider    Answer:   Smitty Cords [[id number]]    Follow up plan: Return in about 4 weeks (around 01/04/2018) for anxiety.   A total of 35 (lvl 3, 4, 5) 15, 25, 40 minutes was spent face-to-face with this patient. Greater than 50% of this time was spent in counseling and coordination of care with the patient regarding anxiety, depression, insomnia for treatment options, sleep hygiene, non-pharm management strategies.    [name], DNP, AGPCNP-BC Adult Gerontology Primary Care Nurse Practitioner [facility] 12/07/2017, 10:24 AM

## 2017-12-10 ENCOUNTER — Encounter: Payer: Self-pay | Admitting: Nurse Practitioner

## 2017-12-10 DIAGNOSIS — F419 Anxiety disorder, unspecified: Principal | ICD-10-CM

## 2017-12-10 DIAGNOSIS — F329 Major depressive disorder, single episode, unspecified: Secondary | ICD-10-CM | POA: Insufficient documentation

## 2017-12-10 DIAGNOSIS — F32A Depression, unspecified: Secondary | ICD-10-CM | POA: Insufficient documentation

## 2017-12-10 DIAGNOSIS — F5104 Psychophysiologic insomnia: Secondary | ICD-10-CM | POA: Insufficient documentation

## 2018-01-04 ENCOUNTER — Ambulatory Visit: Payer: 59 | Admitting: Nurse Practitioner

## 2018-03-02 ENCOUNTER — Encounter: Payer: Self-pay | Admitting: Nurse Practitioner

## 2018-03-02 ENCOUNTER — Other Ambulatory Visit: Payer: Self-pay

## 2018-03-02 ENCOUNTER — Ambulatory Visit (INDEPENDENT_AMBULATORY_CARE_PROVIDER_SITE_OTHER): Payer: 59 | Admitting: Nurse Practitioner

## 2018-03-02 VITALS — BP 128/86 | HR 79 | Temp 98.2°F | Wt 250.0 lb

## 2018-03-02 DIAGNOSIS — G4700 Insomnia, unspecified: Secondary | ICD-10-CM | POA: Diagnosis not present

## 2018-03-02 DIAGNOSIS — Z9189 Other specified personal risk factors, not elsewhere classified: Secondary | ICD-10-CM | POA: Diagnosis not present

## 2018-03-02 NOTE — Progress Notes (Signed)
Subjective:    Patient ID: Jose ArJerry Souder, male    DOB: 07/30/1985, 33 y.o.   MRN: [id number]  Jose Wheeler is a 33 y.o. male presenting on 03/02/2018 for Insomnia (possible referrerral for sleep study. Snoring. Pt sleep about 10hrs a day, but still tired when he wakes up. )   HPI Insomnia Patient has significant sleepiness.  9:30/10am - 6/7pm usually sometimes earlier.  Yesterday woke around 5:45  -  Is getting intterruptions during day with restroom, phone, wakes up snoring.   Has generally been a restless sleeper.  Sleeps more comfortably with "neck roll" type of supine position and some side sleeping.  - When he wakes snoring, does feel like he is gasping for air at times.  Frequent adjustments are for better breathing." - Uses significant caffeine intake during night to stay awake at work. - Some forgetfulness/zoning out. - Is flipping back to daytime wakefulness when not working.   - Thurs sleeps to 6 and tries to stay up to 2-3 am and wakes at 1p Friday.  Sleeps again that night through Saturday, awake Saturday with napping and awake Sunday and naps 1p-6p in preparation for night shift again.  Epworth Sleepiness Scale Patient's Answer Chance of dozing off under normal circumstances  Sitting and reading  2   Watching TV 2 0 = Never  Sitting inactive in a public place 1 1 = Slight chance  As a passenger in a car for an hour without a break 0 2 = Moderate chance  Lying down to rest in the afternoon when circumstances permit 3 3 = High chance  Sitting and talking to someone 0   Sitting quietly after a lunch without alcohol 2   In a car, while stopped for a few minutes in traffic 0                                                              Total Score: 10    0-7: It is unlikely that you are abnormally sleepy. 8-9: You have an average amount of daytime sleepiness. >9: POSITIVE - Recommend further evaluation, sleep specialist or sleep study (>16-24 = severe)   STOP-Bang OSA (scoring  y/n) Snoring y   Tiredness y   Observed apneas y Brother over Christmas  Pressure HTN y   BMI > 35 kg/m2 y   Age > 3250  n   Neck (male >17 in; Male >16 in)  y 618 in  Gender male y   OSA risk low (0-2)  OSA risk intermediate (3-4)  OSA risk high (5+)  Total:     Social History   Tobacco Use  . Smoking status: Never Smoker  . Smokeless tobacco: Never Used  Substance Use Topics  . Alcohol use: No    Alcohol/week: 0.0 standard drinks  . Drug use: No    Review of Systems Per HPI unless specifically indicated above     Objective:    BP (!) 146/82 (BP Location: Right Arm, Patient Position: Sitting, Cuff Size: Large)   Pulse 79   Temp 98.2 F (36.8 C) (Oral)   Wt 250 lb (113.4 kg)   BMI 38.01 kg/m   Wt Readings from Last 3 Encounters:  03/02/18 250 lb (113.4 kg)  12/07/17 243 lb 3.2 oz (  110.3 kg)  09/20/17 248 lb (112.5 kg)    Physical Exam Vitals signs reviewed.  Constitutional:      General: He is not in acute distress.    Appearance: He is well-developed.     Comments: Appears fatigued  HENT:     Head: Normocephalic and atraumatic.  Cardiovascular:     Rate and Rhythm: Normal rate and regular rhythm.     Pulses:          Radial pulses are 2+ on the right side and 2+ on the left side.       Posterior tibial pulses are 1+ on the right side and 1+ on the left side.     Heart sounds: Normal heart sounds, S1 normal and S2 normal.  Pulmonary:     Effort: Pulmonary effort is normal. No respiratory distress.     Breath sounds: Normal breath sounds and air entry.  Musculoskeletal:     Right lower leg: No edema.     Left lower leg: No edema.  Skin:    General: Skin is warm and dry.     Capillary Refill: Capillary refill takes less than 2 seconds.  Neurological:     Mental Status: He is alert and oriented to person, place, and time.  Psychiatric:        Attention and Perception: Attention normal.        Mood and Affect: Mood and affect normal.        Behavior:  Behavior normal. Behavior is cooperative.     Results for orders placed or performed in visit on 10/14/17  Measles/Mumps/Rubella Immunity  Result Value Ref Range   Rubella Antibodies, IGG 3.04 Immune >0.99 index   RUBEOLA AB, IGG 66.5 Immune >29.9 AU/mL   MUMPS ABS, IGG 88.8 Immune >10.9 AU/mL      Assessment & Plan:   Problem List Items Addressed This Visit    None    Visit Diagnoses    Insomnia, unspecified type    -  Primary   Relevant Orders   Ambulatory referral to Sleep Studies   At risk for sleep apnea        Insomnia and fatigue with daytime sleepiness is present.  Patient's daytime sleepiness is during shift work for 3rd shift.  Patient with reasonable hours accommodated for sleep, but never feels adequately rested.  Given positive STOP -bang scoring, patient carries risks for OSA and shift work sleep disorder.  Plan: 1. Sleep study order placed. 2. Encouraged adequate sleep hygiene, exercise. 3. Follow-up after sleep study.     Follow up plan: Return if symptoms worsen or fail to improve.  [name], DNP, AGPCNP-BC Adult Gerontology Primary Care Nurse Practitioner [facility] 03/02/2018, 8:13 AM

## 2018-03-02 NOTE — Patient Instructions (Addendum)
Jose Wheeler,   Thank you for coming in to clinic today.  Please schedule a follow-up appointment with [name], AGNP. Return if symptoms worsen or fail to improve.  If you have any other questions or concerns, please feel free to call the clinic or send a message through MyChart. You may also schedule an earlier appointment if necessary.  You will receive a survey after today's visit either digitally by e-mail or paper by [employer]. Your experiences and feedback matter to [place].  Please respond so we know how we are doing as we provide care for you.   [name], DNP, AGNP-BC Adult Gerontology Nurse Practitioner [facility]     Sleep Studies A sleep study (polysomnogram) is a series of tests done while you are sleeping. A sleep study records your brain waves, heart rate, breathing rate, oxygen level, and eye and leg movements. A sleep study helps your health care provider:  See how well you sleep.  Diagnose a sleep disorder.  Determine how severe your sleep disorder is.  Create a plan to treat your sleep disorder. Your health care provider may recommend a sleep study if you:  Feel sleepy on most days.  Snore loudly while sleeping.  Have unusual behaviors while you sleep, such as walking.  Have brief periods in which you stop breathing during sleep (sleepapnea).  Fall asleep suddenly during the day (narcolepsy).  Have trouble falling asleep or staying asleep (insomnia).  Feel like you need to move your legs when trying to fall asleep (restless legs syndrome).  Move your legs by flexing and extending them regularly while asleep (periodic limb movement disorder).  Act out your dreams while you sleep (sleep behavior disorder).  Feel like you cannot move when you first wake up (sleep paralysis). What tests are part of a sleep study? Most sleep studies record the following during sleep:  Brain activity.  Eye movements.  Heart rate and  rhythm.  Breathing rate and rhythm.  Blood-oxygen level.  Blood pressure.  Chest and belly movement as you breathe.  Arm and leg movements.  Snoring or other noises.  Body position. Where are sleep studies done? Sleep studies are done at sleep centers. A sleep center may be inside a hospital, office, or clinic. The room where you have the study may look like a hospital room or a hotel room. The health care providers doing the study may come in and out of the room during the study. Most of the time, they will be in another room monitoring your test as you sleep. How are sleep studies done? Most sleep studies are done during a normal period of time for a full night of sleep. You will arrive at the study center in the evening and go home in the morning. Before the test  Bring your pajamas and toothbrush with you to the sleep study.  Do not have caffeine on the day of your sleep study.  Do not drink alcohol on the day of your sleep study.  Your health care provider will let you know if you should stop taking any of your regular medicines before the test. During the test      Round, sticky patches with sensors attached to recording wires (electrodes) are placed on your scalp, face, chest, and limbs.  Wires from all the electrodes and sensors run from your bed to a computer. The wires can be taken off and put back on if you need to get out  of bed to go to the bathroom.  A sensor is placed over your nose to measure airflow.  A finger clip is put on your finger or ear to measure your blood oxygen level (pulse oximetry).  A belt is placed around your belly and a belt is placed around your chest to measure breathing movements.  If you have signs of the sleep disorder called sleep apnea during your test, you may get a treatment mask to wear for the second half of the night. ? The mask provides positive airway pressure (PAP) to help you breathe better during sleep. This may greatly  improve your sleep apnea. ? You will then have all tests done again with the mask in place to see if your measurements and recordings change. After the test  A medical doctor who specializes in sleep will evaluate the results of your sleep study and share them with you and your primary health care provider.  Based on your results, your medical history, and a physical exam, you may be diagnosed with a sleep disorder, such as: ? Sleep apnea. ? Restless legs syndrome. ? Sleep-related behavior disorder. ? Sleep-related movement disorders. ? Sleep-related seizure disorders.  Your health care team will help determine your treatment options based on your diagnosis. This may include: ? Improving your sleep habits (sleep hygiene). ? Wearing a continuous positive airway pressure (CPAP) or bi-level positive airway pressure (BPAP) mask. ? Wearing an oral device at night to improve breathing and reduce snoring. ? Taking medicines. Follow these instructions at home:  Take over-the-counter and prescription medicines only as told by your health care provider.  If you are instructed to use a CPAP or BPAP mask, make sure you use it nightly as directed.  Make any lifestyle changes that your health care provider recommends.  If you were given a device to open your airway while you sleep, use it only as told by your health care provider.  Do not use any tobacco products, such as cigarettes, chewing tobacco, and e-cigarettes. If you need help quitting, ask your health care provider.  Keep all follow-up visits as told by your health care provider. This is important. Summary  A sleep study (polysomnogram) is a series of tests done while you are sleeping. It shows how well you sleep.  Most sleep studies are done over one full night of sleep. You will arrive at the study center in the evening and go home in the morning.  If you have signs of the sleep disorder called sleep apnea during your test, you may  get a treatment mask to wear for the second half of the night.  A medical doctor who specializes in sleep will evaluate the results of your sleep study and share them with your primary health care provider. This information is not intended to replace advice given to you by your health care provider. Make sure you discuss any questions you have with your health care provider. Document Released: 08/16/2002 Document Revised: 03/09/2017 Document Reviewed: 03/09/2017 Elsevier Interactive Patient Education  [employer].

## 2018-03-05 ENCOUNTER — Encounter: Payer: Self-pay | Admitting: Nurse Practitioner

## 2018-04-26 DIAGNOSIS — G5603 Carpal tunnel syndrome, bilateral upper limbs: Secondary | ICD-10-CM | POA: Diagnosis not present

## 2018-05-27 DIAGNOSIS — R0981 Nasal congestion: Secondary | ICD-10-CM | POA: Diagnosis not present

## 2018-05-27 DIAGNOSIS — R05 Cough: Secondary | ICD-10-CM | POA: Diagnosis not present

## 2018-05-27 DIAGNOSIS — R5383 Other fatigue: Secondary | ICD-10-CM | POA: Diagnosis not present

## 2018-08-25 ENCOUNTER — Telehealth: Payer: Self-pay | Admitting: Nurse Practitioner

## 2018-08-25 DIAGNOSIS — F329 Major depressive disorder, single episode, unspecified: Secondary | ICD-10-CM

## 2018-08-25 DIAGNOSIS — F419 Anxiety disorder, unspecified: Secondary | ICD-10-CM

## 2018-08-25 MED ORDER — PAROXETINE HCL 40 MG PO TABS: 40.0000 mg | ORAL_TABLET | Freq: Every day | ORAL | 1 refills | Status: DC

## 2018-08-25 NOTE — Telephone Encounter (Signed)
Pt called requesting refill on  paxil called into    [facility] [phone] [address] [place], [place] [address]

## 2018-08-29 ENCOUNTER — Telehealth: Payer: Self-pay | Admitting: Nurse Practitioner

## 2018-08-29 DIAGNOSIS — F419 Anxiety disorder, unspecified: Secondary | ICD-10-CM

## 2018-08-29 DIAGNOSIS — F329 Major depressive disorder, single episode, unspecified: Secondary | ICD-10-CM

## 2018-08-29 MED ORDER — PAROXETINE HCL 40 MG PO TABS: 40.0000 mg | ORAL_TABLET | Freq: Every day | ORAL | 0 refills | Status: AC

## 2018-08-29 NOTE — Telephone Encounter (Signed)
Pt  Called said that ins will pay for a90 day supply of paxil not 60 day supply to [facility] Open ? Closes 10PM  [phone] Pharmacy: Open ? Closes 8PM

## 2018-08-29 NOTE — Telephone Encounter (Signed)
Sent rx 90 day to requested pharmacy  Nobie Putnam, [employer] 08/29/2018, 12:15 PM

## 2018-11-12 ENCOUNTER — Other Ambulatory Visit: Payer: Self-pay

## 2018-11-12 ENCOUNTER — Emergency Department
Admission: EM | Admit: 2018-11-12 | Discharge: 2018-11-12 | Disposition: A | Discharge status: Home / Self Care | Payer: 59 | Attending: Emergency Medicine | Admitting: Emergency Medicine

## 2018-11-12 ENCOUNTER — Encounter: Payer: Self-pay | Admitting: Emergency Medicine

## 2018-11-12 DIAGNOSIS — W540XXA Bitten by dog, initial encounter: Secondary | ICD-10-CM | POA: Insufficient documentation

## 2018-11-12 DIAGNOSIS — Y929 Unspecified place or not applicable: Secondary | ICD-10-CM | POA: Diagnosis not present

## 2018-11-12 DIAGNOSIS — Y999 Unspecified external cause status: Secondary | ICD-10-CM | POA: Diagnosis not present

## 2018-11-12 DIAGNOSIS — S51851A Open bite of right forearm, initial encounter: Secondary | ICD-10-CM | POA: Insufficient documentation

## 2018-11-12 DIAGNOSIS — T148XXA Other injury of unspecified body region, initial encounter: Secondary | ICD-10-CM

## 2018-11-12 DIAGNOSIS — Z79899 Other long term (current) drug therapy: Secondary | ICD-10-CM | POA: Diagnosis not present

## 2018-11-12 DIAGNOSIS — Y9389 Activity, other specified: Secondary | ICD-10-CM | POA: Insufficient documentation

## 2018-11-12 MED ORDER — OXYCODONE-ACETAMINOPHEN 7.5-325 MG PO TABS: 1.0000 | ORAL_TABLET | Freq: Four times a day (QID) | ORAL | 0 refills | Status: DC | PRN

## 2018-11-12 MED ORDER — LIDOCAINE 5 % EX PTCH
1.0000 | MEDICATED_PATCH | CUTANEOUS | Status: DC
  Administered 2018-11-12: 13:00:00 1 via TRANSDERMAL
  Filled 2018-11-12: qty 1

## 2018-11-12 MED ORDER — AMOXICILLIN-POT CLAVULANATE 875-125 MG PO TABS: 1.0000 | ORAL_TABLET | Freq: Two times a day (BID) | ORAL | 0 refills | Status: AC

## 2018-11-12 NOTE — ED Notes (Signed)
[employer] notified of Dog Bite.  Owner is pt's ex-wife address [address] [place].  Owner's phone [phone].  Pt phone [phone]

## 2018-11-12 NOTE — ED Provider Notes (Signed)
[facility] Emergency Department Provider Note   ____________________________________________   First MD Initiated Contact with Patient 11/12/18 1231     (approximate)  I have reviewed the triage vital signs and the nursing notes.   HISTORY  Chief Complaint Animal Bite    HPI Jose Wheeler is a 33 y.o. male patient claims dog bite to the right forearm.  Incident occurred a few hours ago.  Patient had mild bleeding controlled with direct pressure.  Patient dog belongs to the ex-wife.  Patient state dog immunizations up-to-date.  Patient also states tetanus shot is up-to-date.  Patient denies loss sensation or function of the right forearm.  Patient rates pain as a 2/10.  Patient described pain as "achy".         Past Medical History:  Diagnosis Date  . Carpal tunnel syndrome   . Obesity     Patient Active Problem List   Diagnosis Date Noted  . Psychophysiologic insomnia 12/10/2017  . Anxiety and depression 12/10/2017  . Adjustment insomnia 09/14/2016  . Obesity 10/21/2015  . Anxiety 07/16/2015    Past Surgical History:  Procedure Laterality Date  . NO PAST SURGERIES      Prior to Admission medications   Medication Sig Start Date End Date Taking? Authorizing Provider  amoxicillin-clavulanate (AUGMENTIN) 875-125 MG tablet Take 1 tablet by mouth every 12 (twelve) hours for 10 days. 11/12/18 11/22/18  Joni ReiningSmith, Ronald K, PA-C  oxyCODONE-acetaminophen (PERCOCET) 7.5-325 MG tablet Take 1 tablet by mouth every 6 (six) hours as needed. 11/12/18   Joni ReiningSmith, Ronald K, PA-C  PARoxetine (PAXIL) 40 MG tablet Take 1 tablet (40 mg total) by mouth daily. 08/29/18   Smitty CordsKaramalegos, Alexander J, DO    Allergies Patient has no known allergies.  No family history on file.  Social History Social History   Tobacco Use  . Smoking status: Never Smoker  . Smokeless tobacco: Never Used  Substance Use Topics  . Alcohol use: No    Alcohol/week: 0.0 standard drinks  .  Drug use: No    Review of Systems Constitutional: No fever/chills Eyes: No visual changes. ENT: No sore throat. Cardiovascular: Denies chest pain. Respiratory: Denies shortness of breath. Gastrointestinal: No abdominal pain.  No nausea, no vomiting.  No diarrhea.  No constipation. Genitourinary: Negative for dysuria. Musculoskeletal: Negative for back pain. Skin: Negative for rash. Neurological: Negative for headaches, focal weakness or numbness. Psychiatric:  Anxiety and depression ____________________________________________   PHYSICAL EXAM:  VITAL SIGNS: ED Triage Vitals  Enc Vitals Group     BP 11/12/18 1153 (!) 180/109     Pulse Rate 11/12/18 1153 (!) 111     Resp 11/12/18 1153 16     Temp 11/12/18 1153 98.6 F (37 C)     Temp Source 11/12/18 1153 Oral     SpO2 11/12/18 1153 97 %     Weight 11/12/18 1151 260 lb (117.9 kg)     Height 11/12/18 1151 5\' 8"  (1.727 m)     Head Circumference --      Peak Flow --      Pain Score 11/12/18 1151 2     Pain Loc --      Pain Edu? --      Excl. in GC? --     Constitutional: Alert and oriented. Well appearing and in no acute distress. Cardiovascular: Normal rate, regular rhythm. Grossly normal heart sounds.  Good peripheral circulation. Respiratory: Normal respiratory effort.  No retractions. Lungs CTAB. Musculoskeletal: No lower  extremity tenderness nor edema.  No joint effusions. Neurologic:  Normal speech and language. No gross focal neurologic deficits are appreciated. No gait instability. Skin:  Skin is warm, dry and intact. No rash noted.  Puncture wound right forearm. Psychiatric: Mood and affect are normal. Speech and behavior are normal.  ____________________________________________   LABS (all labs ordered are listed, but only abnormal results are displayed)  Labs Reviewed - No data to display ____________________________________________  EKG   ____________________________________________  RADIOLOGY  ED  MD interpretation:    Official radiology report(s): No results found.  ____________________________________________   PROCEDURES  Procedure(s) performed (including Critical Care):  Procedures   ____________________________________________   INITIAL IMPRESSION / ASSESSMENT AND PLAN / ED COURSE  As part of my medical decision making, I reviewed the following data within the electronic MEDICAL RECORD NUMBER         Jose Wheeler was evaluated in Emergency Department on 11/12/2018 for the symptoms described in the history of present illness. He was evaluated in the context of the global COVID-19 pandemic, which necessitated consideration that the patient might be at risk for infection with the SARS-CoV-2 virus that causes COVID-19. Institutional protocols and algorithms that pertain to the evaluation of patients at risk for COVID-19 are in a state of rapid change based on information released by regulatory bodies including the CDC and federal and state organizations. These policies and algorithms were followed during the patient's care in the ED.  Patient presents with dog bite to the right forearm.  Patient tetanus is up-to-date.  Patient state animal shots are up-to-date.  Discussed rationale for not closing dog bites.  Patient given discharge care instruction advised take medication as directed.  Patient may follow-up with PCP.      ____________________________________________   FINAL CLINICAL IMPRESSION(S) / ED DIAGNOSES  Final diagnoses:  Animal bite     ED Discharge Orders         Ordered    oxyCODONE-acetaminophen (PERCOCET) 7.5-325 MG tablet  Every 6 hours PRN     11/12/18 1239    amoxicillin-clavulanate (AUGMENTIN) 875-125 MG tablet  Every 12 hours     11/12/18 1239           Note:  This document was prepared using Dragon voice recognition software and may include unintentional dictation errors.    Sable Feil, PA-C 11/12/18 1245    Carrie Mew, MD  11/13/18 [phone]

## 2018-11-12 NOTE — Discharge Instructions (Addendum)
Follow discharge care instruction take medication as directed. °

## 2018-11-12 NOTE — ED Triage Notes (Signed)
Pt to ED via POV c/o dog bite to the right arm. Pt states that it happened a few hours ago. Pt states that it is still bleeding and that he is having a lot of pain in the area. Bite has not been reported. The dog belongs to his ex-wife. Dog is UTD on shots as far as his know.

## 2018-11-12 NOTE — ED Notes (Signed)
Dressing applied and wound cleaned by this RN. Pt tolerated well.

## 2018-12-08 ENCOUNTER — Other Ambulatory Visit: Payer: Self-pay

## 2018-12-08 ENCOUNTER — Ambulatory Visit (INDEPENDENT_AMBULATORY_CARE_PROVIDER_SITE_OTHER): Payer: 59 | Admitting: Nurse Practitioner

## 2018-12-08 ENCOUNTER — Encounter: Payer: Self-pay | Admitting: Nurse Practitioner

## 2018-12-08 VITALS — Ht 68.0 in | Wt 260.0 lb

## 2018-12-08 DIAGNOSIS — R29818 Other symptoms and signs involving the nervous system: Secondary | ICD-10-CM | POA: Diagnosis not present

## 2018-12-08 NOTE — Progress Notes (Signed)
Telemedicine Encounter: Disclosed to patient at start of encounter that we will provide appropriate telemedicine services.  Patient consents to be treated via phone prior to discussion. - Patient is at his home and is accessed via telephone. - Services are provided by [name] from [facility].  Subjective:    Patient ID: Jose Wheeler, male    DOB: [birth year], 33 y.o.   MRN: [id number]  Jose Wheeler is a 33 y.o. male presenting on 12/08/2018 for Snoring (pt requesting a sleep study, because he told that he snores at night. He also feel like he never gets enough rest when he wake up.)  HPI  Snoring, Observed Apneas Patient has had longstanding insomnia, but previously complicated by shift work sleeping for third shift.  Patient is currently sleeping night.  Works second shift currently.  Patient notes in general improved sleep compared to third shift.  Falling asleep in dark, waking in light is much improved over daytime sleeping. - Snoring regularly - family, friends report.  Brother mentions he stops breathing then starts again mid-snore. - Regularly patient notes he wakes up gasping for air.  Has trouble getting to sleep for comfort to prevent waking up choking.  Pillow between shoulder blades and lean head back.  This is causing pain, but patient persists due to ability to breathe/continue sleeping. - Significant daytime sleepiness: Patient drinks about 400 mg caffeine.  Sleep total 10 hours, but frequently interrupted, not feeling rested when waking up.  STOP-Bang OSA (scoring y/n) Snoring y   Tiredness y   Observed apneas y   Pressure HTN y Stage 1  BMI > 35 kg/m2 y   Age > 110  n   Neck (male >17 in; Male >16 in)  n/a  (17 in shirt size)  Gender male y   OSA risk low (0-2)  OSA risk intermediate (3-4)  OSA risk high (5+)  Total:    - 17" neck size for shirt is not too tight.    Epworth Sleepiness Scale Patient's Answer Chance of dozing off under normal  circumstances  Sitting and reading  0   Watching TV 1 0 = Never  Sitting inactive in a public place 0 1 = Slight chance  As a passenger in a car for an hour without a break 2 2 = Moderate chance  Lying down to rest in the afternoon when circumstances permit 3 3 = High chance  Sitting and talking to someone 0   Sitting quietly after a lunch without alcohol 2   In a car, while stopped for a few minutes in traffic 0                                                              Total Score: 8    0-7: It is unlikely that you are abnormally sleepy. 8-9: You have an average amount of daytime sleepiness. >9: POSITIVE - Recommend further evaluation, sleep specialist or sleep study (>16-24 = severe)   Patient now in triangle area - referral to [facility] requested  Social History   Tobacco Use  . Smoking status: Never Smoker  . Smokeless tobacco: Never Used  Substance Use Topics  . Alcohol use: No    Alcohol/week: 0.0 standard drinks  . Drug  use: No    Review of Systems Per HPI unless specifically indicated above     Objective:    There were no vitals taken for this visit.  Wt Readings from Last 3 Encounters:  11/12/18 259 lb 14.8 oz (117.9 kg)  03/02/18 250 lb (113.4 kg)  12/07/17 243 lb 3.2 oz (110.3 kg)    Physical Exam Patient remotely monitored.  Verbal communication appropriate.  Cognition normal.   Results for orders placed or performed in visit on 10/14/17  Measles/Mumps/Rubella Immunity  Result Value Ref Range   Rubella Antibodies, IGG 3.04 Immune >0.99 index   RUBEOLA AB, IGG 66.5 Immune >29.9 AU/mL   MUMPS ABS, IGG 88.8 Immune >10.9 AU/mL      Assessment & Plan:   Problem List Items Addressed This Visit    None    Visit Diagnoses    Suspected sleep apnea    -  Primary   Relevant Orders   Ambulatory referral to Sleep Studies    Currently high risk, suspected sleep apnea. Patient with longstanding daytime sleepiness, insomnia, snoring, observed  apneas.  Stage I hypertension, obesity, and larger neck circumference (Not measured today due to remote visit - 17 in shirt size required). Positive Epworth and Stop-Bang scores for risk for sleep apnea.  Plan: 1. Referral sleep study  2. Follow-up after study prn.   - Time spent in direct consultation with patient via telemedicine about above concerns: 14 minutes  Follow up plan: Follow-up prn 4 weeks after study  Cassell Smiles, DNP, AGPCNP-BC Adult Gerontology Primary Care Nurse Practitioner [facility] 12/08/2018, 9:14 AM

## 2018-12-08 NOTE — Progress Notes (Signed)
EpWorth Scale   1) No 2) No 3) 2 4)3 5)No 6)2 7) No Score= 7

## 2018-12-10 ENCOUNTER — Encounter: Payer: Self-pay | Admitting: Nurse Practitioner
